# Patient Record
Sex: Male | Born: 1980 | Race: White | Hispanic: No | Marital: Married | Smoking: Current some day smoker
Health system: Southern US, Community
[De-identification: ages and names within clinical notes are randomized; demographics above are authoritative.]

---
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
## 2005-06-22 ENCOUNTER — Emergency Department (HOSPITAL_COMMUNITY): Admission: EM | Admit: 2005-06-22 | Discharge: 2005-06-22 | Payer: Self-pay | Admitting: Emergency Medicine

## 2005-06-27 ENCOUNTER — Ambulatory Visit: Payer: Self-pay | Admitting: Internal Medicine

## 2005-06-28 ENCOUNTER — Inpatient Hospital Stay (HOSPITAL_COMMUNITY): Admission: EM | Admit: 2005-06-28 | Discharge: 2005-07-02 | Payer: Self-pay | Admitting: Emergency Medicine

## 2005-08-15 ENCOUNTER — Ambulatory Visit: Payer: Self-pay | Admitting: Internal Medicine

## 2008-03-12 HISTORY — PX: ANTERIOR CRUCIATE LIGAMENT REPAIR: SHX115

## 2008-11-17 ENCOUNTER — Emergency Department: Payer: Self-pay | Admitting: Emergency Medicine

## 2014-07-24 ENCOUNTER — Encounter (HOSPITAL_COMMUNITY): Payer: Self-pay | Admitting: Emergency Medicine

## 2014-07-24 ENCOUNTER — Emergency Department (HOSPITAL_COMMUNITY)
Admission: EM | Admit: 2014-07-24 | Discharge: 2014-07-24 | Disposition: A | Discharge status: Home / Self Care | Payer: BLUE CROSS/BLUE SHIELD | Attending: Emergency Medicine | Admitting: Emergency Medicine

## 2014-07-24 DIAGNOSIS — K0381 Cracked tooth: Secondary | ICD-10-CM | POA: Insufficient documentation

## 2014-07-24 DIAGNOSIS — K088 Other specified disorders of teeth and supporting structures: Secondary | ICD-10-CM | POA: Insufficient documentation

## 2014-07-24 DIAGNOSIS — K0889 Other specified disorders of teeth and supporting structures: Secondary | ICD-10-CM

## 2014-07-24 DIAGNOSIS — Z720 Tobacco use: Secondary | ICD-10-CM | POA: Insufficient documentation

## 2014-07-24 MED ORDER — HYDROCODONE-ACETAMINOPHEN 5-325 MG PO TABS: 1.0000 | ORAL_TABLET | ORAL | Status: DC | PRN

## 2014-07-24 NOTE — Discharge Instructions (Signed)
Read the information below.  Use the prescribed medication as directed.  Please discuss all new medications with your pharmacist.  Do not take additional tylenol while taking the prescribed pain medication to avoid overdose.  You may return to the Emergency Department at any time for worsening condition or any new symptoms that concern you.  If you develop fevers, swelling in your face, difficulty swallowing or breathing, return to the ER immediately for a recheck.    Dental Pain A tooth ache may be caused by cavities (tooth decay). Cavities expose the nerve of the tooth to air and hot or cold temperatures. It may come from an infection or abscess (also called a boil or furuncle) around your tooth. It is also often caused by dental caries (tooth decay). This causes the pain you are having. DIAGNOSIS  Your caregiver can diagnose this problem by exam. TREATMENT   If caused by an infection, it may be treated with medications which kill germs (antibiotics) and pain medications as prescribed by your caregiver. Take medications as directed.  Only take over-the-counter or prescription medicines for pain, discomfort, or fever as directed by your caregiver.  Whether the tooth ache today is caused by infection or dental disease, you should see your dentist as soon as possible for further care. SEEK MEDICAL CARE IF: The exam and treatment you received today has been provided on an emergency basis only. This is not a substitute for complete medical or dental care. If your problem worsens or new problems (symptoms) appear, and you are unable to meet with your dentist, call or return to this location. SEEK IMMEDIATE MEDICAL CARE IF:   You have a fever.  You develop redness and swelling of your face, jaw, or neck.  You are unable to open your mouth.  You have severe pain uncontrolled by pain medicine. MAKE SURE YOU:   Understand these instructions.  Will watch your condition.  Will get help right  away if you are not doing well or get worse. Document Released: 02/26/2005 Document Revised: 05/21/2011 Document Reviewed: 10/15/2007 [facility] Patient Information 2015 [employer] [place]. This information is not intended to replace advice given to you by your health care provider. Make sure you discuss any questions you have with your health care provider.

## 2014-07-24 NOTE — ED Provider Notes (Signed)
CSN: [id number]     Arrival date & time 07/24/14  0036 History   First MD Initiated Contact with Patient 07/24/14 [phone]     Chief Complaint  Patient presents with  . Dental Pain     (Consider location/radiation/quality/duration/timing/severity/associated sxs/prior Treatment) HPI   Patient presents with left lower molar pain.  States he had a cavity that broke several weeks ago, developed pain three days ago and severe pain today.  Was seen at Urgent Care earlier today and was given toradol IM and toradol prescription.  Has been unable to sleep secondary to pain.  The only relief he has gotten is from swishing cold water around the tooth.  [facility]  Denies fevers, facial swelling, sore throat, difficulty swallowing or breathing.    History reviewed. No pertinent past medical history. Past Surgical History  Procedure Laterality Date  . Anterior cruciate ligament repair  2010   No family history on file. History  Substance Use Topics  . Smoking status: Current Some Day Smoker    Types: Cigarettes  . Smokeless tobacco: Not on file  . Alcohol Use: No    Review of Systems  Constitutional: Negative for fever and chills.  HENT: Positive for dental problem. Negative for facial swelling, sore throat and trouble swallowing.   Gastrointestinal: Negative for nausea and vomiting.  Musculoskeletal: Negative for neck pain and neck stiffness.  Skin: Negative for color change and wound.  Allergic/Immunologic: Negative for immunocompromised state.  Psychiatric/Behavioral: Negative for self-injury.      Allergies  Review of patient's allergies indicates no known allergies.  Home Medications   Prior to Admission medications   Medication Sig Start Date End Date Taking? Authorizing Provider  HYDROcodone-acetaminophen (NORCO/VICODIN) 5-325 MG per tablet Take 1-2 tablets by mouth every 4 (four) hours as needed. 07/24/14   Trixie DredgeEmily  Talullah Abate, PA-C   BP 129/84 mmHg  Pulse 66  Temp(Src) 98.1 F (36.7 C) (Oral)  Resp 16  Ht 6\' 3"  (1.905 m)  Wt 262 lb 12.6 oz (119.2 kg)  BMI 32.85 kg/m2  SpO2 97% Physical Exam  Constitutional: He appears well-developed and well-nourished. No distress.  HENT:  Head: Normocephalic and atraumatic.  Mouth/Throat: Uvula is midline and oropharynx is clear and moist. Mucous membranes are not dry. Abnormal dentition. No uvula swelling. No oropharyngeal exudate, posterior oropharyngeal edema, posterior oropharyngeal erythema or tonsillar abscesses.    Gingiva without erythema, edema, discharge.  No facial swelling or tenderness.    Eyes: Conjunctivae are normal.  Neck: Normal range of motion. Neck supple.  Cardiovascular: Normal rate.   Pulmonary/Chest: Effort normal and breath sounds normal. No stridor.  Lymphadenopathy:    He has no cervical adenopathy.  Neurological: He is alert.  Skin: He is not diaphoretic.  Psychiatric: He has a normal mood and affect. His behavior is normal.  Nursing note and vitals reviewed.   ED Course  Procedures (including critical care time) Labs Review Labs Reviewed - No data to display  Imaging Review No results found.   EKG Interpretation None      MDM   Final diagnoses:  Pain, dental    Afebrile, nontoxic patient with new dental pain.  No obvious abscess.  No concerning findings on exam.  Doubt deep space head or neck infection.  Doubt Ludwig's angina.  D/C home with pain medication and dental follow up tomorrow.  Given close follow up with dentist in approximately 12 hours and no  obvious abscess, will defer decision regarding antibiotics to dentist.  Discussed findings, treatment, and follow up  with patient.  Pt given return precautions.  Pt verbalizes understanding and agrees with plan.         Trixie Dredgemily Tyshun Tuckerman, PA-C 07/24/14 [id number]  Mirian MoMatthew Gentry, MD 07/26/14 [phone]

## 2014-07-24 NOTE — ED Notes (Signed)
Pt reports a cracked tooth in his upper left mouth, pt reports onset pain 3 days. Pt reports he has an appt tomorrow at 1400 with the dentist. Pt he for pain management. [facility], pt reports it is not managing his pain and he is not able to sleep.

## 2020-07-16 ENCOUNTER — Other Ambulatory Visit: Payer: Self-pay

## 2020-07-16 ENCOUNTER — Emergency Department (HOSPITAL_COMMUNITY): Payer: BC Managed Care – PPO

## 2020-07-16 ENCOUNTER — Encounter (HOSPITAL_COMMUNITY): Payer: Self-pay | Admitting: Emergency Medicine

## 2020-07-16 ENCOUNTER — Observation Stay (HOSPITAL_COMMUNITY)
Admission: EM | Admit: 2020-07-16 | Discharge: 2020-07-17 | Disposition: A | Discharge status: Home / Self Care | Payer: BC Managed Care – PPO | Attending: Internal Medicine | Admitting: Internal Medicine

## 2020-07-16 DIAGNOSIS — H1089 Other conjunctivitis: Secondary | ICD-10-CM | POA: Diagnosis not present

## 2020-07-16 DIAGNOSIS — Z20822 Contact with and (suspected) exposure to covid-19: Secondary | ICD-10-CM | POA: Diagnosis not present

## 2020-07-16 DIAGNOSIS — H109 Unspecified conjunctivitis: Secondary | ICD-10-CM | POA: Diagnosis present

## 2020-07-16 DIAGNOSIS — B9689 Other specified bacterial agents as the cause of diseases classified elsewhere: Secondary | ICD-10-CM | POA: Diagnosis present

## 2020-07-16 DIAGNOSIS — R059 Cough, unspecified: Secondary | ICD-10-CM | POA: Insufficient documentation

## 2020-07-16 DIAGNOSIS — R42 Dizziness and giddiness: Secondary | ICD-10-CM | POA: Diagnosis present

## 2020-07-16 DIAGNOSIS — F17210 Nicotine dependence, cigarettes, uncomplicated: Secondary | ICD-10-CM | POA: Insufficient documentation

## 2020-07-16 DIAGNOSIS — R413 Other amnesia: Secondary | ICD-10-CM

## 2020-07-16 DIAGNOSIS — J189 Pneumonia, unspecified organism: Secondary | ICD-10-CM

## 2020-07-16 DIAGNOSIS — G9340 Encephalopathy, unspecified: Principal | ICD-10-CM | POA: Insufficient documentation

## 2020-07-16 DIAGNOSIS — R4789 Other speech disturbances: Secondary | ICD-10-CM

## 2020-07-16 LAB — I-STAT CHEM 8, ED
BUN: 19 mg/dL (ref 6–20)
Calcium, Ion: 1.24 mmol/L (ref 1.15–1.40)
Chloride: 105 mmol/L (ref 98–111)
Creatinine, Ser: 1 mg/dL (ref 0.61–1.24)
Glucose, Bld: 105 mg/dL — ABNORMAL HIGH (ref 70–99)
HCT: 43 % (ref 39.0–52.0)
Hemoglobin: 14.6 g/dL (ref 13.0–17.0)
Potassium: 3.6 mmol/L (ref 3.5–5.1)
Sodium: 144 mmol/L (ref 135–145)
TCO2: 28 mmol/L (ref 22–32)

## 2020-07-16 LAB — APTT: aPTT: 28 seconds (ref 24–36)

## 2020-07-16 LAB — COMPREHENSIVE METABOLIC PANEL
ALT: 31 U/L (ref 0–44)
AST: 20 U/L (ref 15–41)
Albumin: 4.4 g/dL (ref 3.5–5.0)
Alkaline Phosphatase: 92 U/L (ref 38–126)
Anion gap: 6 (ref 5–15)
BUN: 16 mg/dL (ref 6–20)
CO2: 26 mmol/L (ref 22–32)
Calcium: 9.3 mg/dL (ref 8.9–10.3)
Chloride: 108 mmol/L (ref 98–111)
Creatinine, Ser: 1.07 mg/dL (ref 0.61–1.24)
GFR, Estimated: >60 mL/min (ref >60)
Glucose, Bld: 109 mg/dL — ABNORMAL HIGH (ref 70–99)
Potassium: 3.7 mmol/L (ref 3.5–5.1)
Sodium: 140 mmol/L (ref 135–145)
Total Bilirubin: 0.6 mg/dL (ref 0.3–1.2)
Total Protein: 7.2 g/dL (ref 6.5–8.1)

## 2020-07-16 LAB — CSF CELL COUNT WITH DIFFERENTIAL
RBC Count, CSF: 1000 /mm3 — ABNORMAL HIGH
RBC Count, CSF: 14 /mm3 — ABNORMAL HIGH
Tube #: 1
Tube #: 4
WBC, CSF: 1 /mm3 (ref 0–5)
WBC, CSF: 4 /mm3 (ref 0–5)

## 2020-07-16 LAB — CBC
HCT: 45.1 % (ref 39.0–52.0)
Hemoglobin: 14.7 g/dL (ref 13.0–17.0)
MCH: 30.8 pg (ref 26.0–34.0)
MCHC: 32.6 g/dL (ref 30.0–36.0)
MCV: 94.4 fL (ref 80.0–100.0)
Platelets: 260 10*3/uL (ref 150–400)
RBC: 4.78 MIL/uL (ref 4.22–5.81)
RDW: 12.7 % (ref 11.5–15.5)
WBC: 9.7 10*3/uL (ref 4.0–10.5)
nRBC: 0 % (ref 0.0–0.2)

## 2020-07-16 LAB — DIFFERENTIAL
Abs Immature Granulocytes: 0.05 10*3/uL (ref 0.00–0.07)
Basophils Absolute: 0.1 10*3/uL (ref 0.0–0.1)
Basophils Relative: 1 %
Eosinophils Absolute: 0.2 10*3/uL (ref 0.0–0.5)
Eosinophils Relative: 2 %
Immature Granulocytes: 1 %
Lymphocytes Relative: 40 %
Lymphs Abs: 3.8 10*3/uL (ref 0.7–4.0)
Monocytes Absolute: 0.7 10*3/uL (ref 0.1–1.0)
Monocytes Relative: 7 %
Neutro Abs: 4.9 10*3/uL (ref 1.7–7.7)
Neutrophils Relative %: 49 %

## 2020-07-16 LAB — RESP PANEL BY RT-PCR (FLU A&B, COVID) ARPGX2
Influenza A by PCR: NEGATIVE
Influenza B by PCR: NEGATIVE
SARS Coronavirus 2 by RT PCR: NEGATIVE

## 2020-07-16 LAB — PROTIME-INR
INR: 1 (ref 0.8–1.2)
Prothrombin Time: 13.5 seconds (ref 11.4–15.2)

## 2020-07-16 LAB — PROTEIN AND GLUCOSE, CSF
Glucose, CSF: 67 mg/dL (ref 40–70)
Total  Protein, CSF: 26 mg/dL (ref 15–45)

## 2020-07-16 MED ORDER — SODIUM CHLORIDE 0.9% FLUSH
3.0000 mL | Freq: Once | INTRAVENOUS | Status: AC
Start: 2020-07-16 — End: 2020-07-17
  Administered 2020-07-17: 3 mL via INTRAVENOUS

## 2020-07-16 MED ORDER — GADOBUTROL 1 MMOL/ML IV SOLN
10.0000 mL | Freq: Once | INTRAVENOUS | Status: AC | PRN
  Administered 2020-07-16: 10 mL via INTRAVENOUS

## 2020-07-16 MED ORDER — LIDOCAINE HCL (PF) 1 % IJ SOLN
INTRAMUSCULAR | Status: AC
  Filled 2020-07-16: qty 30

## 2020-07-16 MED ORDER — LORAZEPAM 2 MG/ML IJ SOLN
2.0000 mg | Freq: Once | INTRAMUSCULAR | Status: AC
  Administered 2020-07-16: 2 mg via INTRAVENOUS
  Filled 2020-07-16: qty 1

## 2020-07-16 NOTE — ED Notes (Signed)
This writer pulled 30 mL of 1% Lidocaine for provider for LP procedure.

## 2020-07-16 NOTE — Consult Note (Signed)
Neurology Consultation  Reason for Consult: Speech difficulty, altered mental status-code stroke Referring Physician: Dr. Gerhard Munch  CC: Speech difficulty, altered mental status  History is obtained from: Patient, wife  HPI: Jorge Swanson is a 40 y.o. male no significant past medical history, who was in his usual state of health till about 4:30 PM today when he took his daughter for vaccination at the local pharmacy, then called his wife to come pick him up because he did not feel right.  He described not feeling in his usual state of health and his wife said that he called him multiple times forgetting that he had called her earlier to ask her to come pick him up. His speech has been tangential.  He has been having difficulty forming complete sentences. 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 He works a full-time job as a Warehouse manager of a company and is a father of 5 children, married. Does not do illicit drugs.  Drinks alcohol-4-5 beers over the weekend and maybe 1-2 drinks per week on weekdays. Denies any recent illnesses or sicknesses.  Denies any toxic exposure. Has history of some sort of an autoimmune Disease where she has a brain lesion which was biopsied but no specific results came out of it and a repeat biopsy was recommended.  On repeat imaging of the mother, after steroids, the lesion had resolved.  Unclear what kind of autoimmune family history he has. Reports he had a history of migraines for 3 to 4 years in the middle when he was in college in [employer].  Had symptoms of speech changes with some of those headaches but has not had them for years.  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  Has not had headaches since then.  Dr. Jeraldine Loots called me to discuss this case with me-initial concern was some for some word finding  difficulty and repetitive questioning-I asked him to activate a code stroke given the young age of the patient and he was still in the window for IV tPA. 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 Cough for  Last few months. Redness right eye - wears 30 day lenses for months at a time.  History reviewed. No pertinent past medical history. Past medical history: Documented in the HPI  Family history: Mother with autoimmune condition unspecified   Social History:   reports that he has been smoking cigarettes. He does not have any smokeless tobacco history on file. He reports current alcohol use. He reports that he does not use drugs.  Medications  Current Facility-Administered Medications:  .  sodium chloride flush (NS) 0.9 % injection 3 mL, 3 mL, Intravenous, Once, Gerhard Munch, MD  Current Outpatient Medications:  .  HYDROcodone-acetaminophen (NORCO/VICODIN) 5-325 MG per tablet, Take 1-2 tablets by mouth every 4 (four) hours as needed. (Patient not taking: No sig reported), Disp: 8 tablet, Rfl: 0   Exam: Current vital signs: BP (!) 133/95 (BP Location: Left Arm)   Pulse  99   Temp 98.4 F (36.9 C)   Resp 18   SpO2 98%  Vital signs in last 24 hours: Temp:  [98.4 F (36.9 C)] 98.4 F (36.9 C) (05/07 1845) Pulse Rate:  [99] 99 (05/07 1845) Resp:  [18] 18 (05/07 1845) BP: (133)/(95) 133/95 (05/07 1845) SpO2:  [98 %] 98 % (05/07 1845) General: Awake alert in no distress HEENT: Normocephalic atraumatic, right eye with severe conjunctival redness CVS: Regular rhythm Abdomen nondistended nontender Extremities warm well perfused Neurologic exam Awake alert oriented  x3 Attention concentration: Does not have reduced attention concentration but has tangential speech and has more wordy speech output which is out of character for him according to the wife Speech and language: Has fluent speech, with no dysarthria.  Able to name simple objects and repeat.  Able to comprehend. Cranial nerves: Pupils equal round react light, extract movements intact, visual fields full, face symmetric, facial sensation intact, auditory acuity intact, palate midline, shoulder shrug intact, tongue midline. Motor exam 5/5 in all fours without drift Sensory exam intact light touch without drift Coordination: No dysmetria, no asterixis Gait testing deferred but presumably was normal when he walked. NIH stroke scale--0  Labs I have reviewed labs in epic and the results pertinent to this consultation are:   CBC    Component Value Date/Time   WBC 9.7 07/16/2020 1856   RBC 4.78 07/16/2020 1856   HGB 14.6 07/16/2020 1857   HCT 43.0 07/16/2020 1857   PLT 260 07/16/2020 1856   MCV 94.4 07/16/2020 1856   MCH 30.8 07/16/2020 1856   MCHC 32.6 07/16/2020 1856   RDW 12.7 07/16/2020 1856   LYMPHSABS 3.8 07/16/2020 1856   MONOABS 0.7 07/16/2020 1856   EOSABS 0.2 07/16/2020 1856   BASOSABS 0.1 07/16/2020 1856    CMP     Component Value Date/Time   NA 144 07/16/2020 1857   K 3.6 07/16/2020 1857   CL 105 07/16/2020 1857   CO2 26 07/16/2020 1856   GLUCOSE 105 (H) 07/16/2020 1857   BUN 19 07/16/2020 1857   CREATININE 1.00 07/16/2020 1857   CALCIUM 9.3 07/16/2020 1856   PROT 7.2 07/16/2020 1856   ALBUMIN 4.4 07/16/2020 1856   AST 20 07/16/2020 1856   ALT 31 07/16/2020 1856   ALKPHOS 92 07/16/2020 1856   BILITOT 0.6 07/16/2020 1856   GFRNONAA >60 07/16/2020 1856    Imaging I have reviewed the images obtained:  CT-scan of the brain- no acute changes, normal head CT  MRI examination of the brain with and without contrast  Assessment:  40 year old man with sudden onset  of altered mental status.  He was with his daughter at a pharmacy and called his wife saying he is not feeling well.  He called her again forgetting that he had called her laterally 5 minutes ago.  He has had some repetition of questioning.  Wife also noted that his speech has become more wordy without any obvious speech deficits but felt that he was having trouble saying what he wanted to say. I was called to consult on him and recommended a code stroke be activated since his last known normal was within 4-1/2 hours. Is exam was nonfocal-see above but he was very tangential and wordy in his speech which is out of character, and also had some hurried speech again out of character for him. Noncontrast head CT was unremarkable I took him for a stat MRI of the brain with and without contrast-preliminary review of  the DWI images negative for stroke.  Full sequences pending at this time. Differentials include autoimmune encephalitis/viral encephalitis/TGA Toxic metabolic encephalopathy should also be considered in the differentials  Impression: Differentials include autoimmune encephalitis/viral encephalitis/TGA Toxic metabolic encephalopathy should also be considered in the differentials  Recommendations:  Stat MRI brain with and without contrast-being done right now.  Await full results.  If there is no abnormality seen on the brain MRI, I would recommend doing a spinal tap and obtain at least 12 to 15 cc of CSF-and send the CSF for glucose, protein, cell count, HSV PCR, gram stain, CSF culture, autoimmune encephalitis panel. I will update recommendations and the above results are made available. Discussed my plan with Dr. Jeraldine Loots in the ER Discussed my plan with the wife at bedside as well.  -- Milon Dikes, MD Neurologist [facility] Pager: [phone]  ADDENDUM MRI brain with and without contrast  - normal. His mother texted-she has neurosarcoid-that is what the patient  earlier referred to. Patient has had cough for 2 months. Also primary team suspicious for bacterial conjunctivitis. Pursuing a broad encephalopathy w/u. CSF with 1000 RBC, 4 WBC, glucose 67, protein 26. Not looking concerning for CNS infectious or inflammatory process.  Will recommend overnight observation and EEG in the AM. B12, TSH, Ammonia should be checked along with RPR, HIV.  D/W Dr. Leafy Half - admitting hospitalist.  -- Milon Dikes, MD Neurologist [facility] Pager: [phone]

## 2020-07-16 NOTE — ED Triage Notes (Addendum)
Pt reports he is having approx 30 second intervals of forgetting what he is saying or what is happening.  Also reports intermittent dizziness.  States symptoms started at 6pm while in [place] for his daughter to get her COVID vaccine.  No arm drift.  Denies numbness/weakness.

## 2020-07-16 NOTE — ED Provider Notes (Signed)
[facility] EMERGENCY DEPARTMENT Provider Note   CSN: [id number] Arrival date & time: 07/16/20  1838     History Chief Complaint  Patient presents with  . Dizziness    Jorge Swanson is a 40 y.o. male.  HPI Patient presents with sudden onset memory loss and speech difficulty.  History is obtained by the patient, though his wife joins him later in the evaluation, and corroborates history.  Patient was in his usual state of health prior to about 1 hour ago.  At that time, while in a similar stressful situation he felt onset of difficulty with speech and forgetfulness.  Subsequently he has had multiple episodes of memory lapse, with inability to recall what occurred several minutes prior.  He notes some difficulty with finding words, but no obvious slurring of speech, no weakness in any extremity, no pain.  Patient has a history of prior cardiac ablation as a youth, but no current cardiac issues, he also denies any substantial health issues.  He drinks socially, vapes.  He does note that he may have used a different material in his vape today as well. Wife notes the patient called twice requesting to be picked up because he was feeling unwell, but did not recall calling given the first time.    History reviewed. No pertinent past medical history.  There are no problems to display for this patient.   Past Surgical History:  Procedure Laterality Date  . ANTERIOR CRUCIATE LIGAMENT REPAIR  2010       No family history on file.  Social History   Tobacco Use  . Smoking status: Current Some Day Smoker    Types: Cigarettes  Substance Use Topics  . Alcohol use: Yes  . Drug use: Never    Home Medications Prior to Admission medications   Medication Sig Start Date End Date Taking? Authorizing Provider  HYDROcodone-acetaminophen (NORCO/VICODIN) 5-325 MG per tablet Take 1-2 tablets by mouth every 4 (four) hours as needed. Patient not taking: No sig reported 07/24/14    Trixie Dredge, [place]    Allergies    Patient has no known allergies.  Review of Systems   Review of Systems  Constitutional:       Per HPI, otherwise negative  HENT:       Per HPI, otherwise negative  Respiratory:       Per HPI, otherwise negative  Cardiovascular:       Per HPI, otherwise negative  Gastrointestinal: Negative for vomiting.  Endocrine:       Negative aside from HPI  Genitourinary:       Neg aside from HPI   Musculoskeletal:       Per HPI, otherwise negative  Skin: Negative.   Neurological: Positive for speech difficulty. Negative for syncope.    Physical Exam Updated Vital Signs BP 127/79   Pulse 85   Temp 98.4 F (36.9 C)   Resp 18   SpO2 97%   Physical Exam Vitals and nursing note reviewed.  Constitutional:      General: He is not in acute distress.    Appearance: He is well-developed.  HENT:     Head: Normocephalic and atraumatic.  Eyes:     Conjunctiva/sclera: Conjunctivae normal.  Cardiovascular:     Rate and Rhythm: Normal rate and regular rhythm.  Pulmonary:     Effort: Pulmonary effort is normal. No respiratory distress.     Breath sounds: No stridor.  Abdominal:     General: There  is no distension.  Skin:    General: Skin is warm and dry.  Neurological:     Mental Status: He is alert and oriented to person, place, and time.     Cranial Nerves: Cranial nerves are intact.     Motor: No weakness, tremor, atrophy or abnormal muscle tone.     Comments: Patient with occasional halting speech. He also notes that during our conversation he has frequent periods during which she does not recall what he said a few minutes prior.  Otherwise neurologic exam unremarkable. Moves all extremity spontaneously, no facial asymmetry, no confusion.  Psychiatric:        Mood and Affect: Mood normal.        Behavior: Behavior normal.      ED Results / Procedures / Treatments   Labs (all labs ordered are listed, but only abnormal results are  displayed) Labs Reviewed  COMPREHENSIVE METABOLIC PANEL - Abnormal; Notable for the following components:      Result Value   Glucose, Bld 109 (*)    All other components within normal limits  I-STAT CHEM 8, ED - Abnormal; Notable for the following components:   Glucose, Bld 105 (*)    All other components within normal limits  RESP PANEL BY RT-PCR (FLU A&B, COVID) ARPGX2  PROTIME-INR  APTT  CBC  DIFFERENTIAL  RAPID URINE DRUG SCREEN, HOSP PERFORMED  PROTEIN AND GLUCOSE, CSF  HSV 1/2 PCR, CSF  CSF CELL COUNT WITH DIFFERENTIAL  CSF CELL COUNT WITH DIFFERENTIAL  MISC [facility] TEST (SEND OUT)  IGG CSF INDEX  DRAW EXTRA CLOT TUBE  OLIGOCLONAL BANDS, CSF + SERM  DRAW EXTRA CLOT TUBE  CBG MONITORING, ED    EKG EKG Interpretation  Date/Time:  Saturday Jul 16 2020 18:46:15 EDT Ventricular Rate:  104 PR Interval:  154 QRS Duration: 100 QT Interval:  352 QTC Calculation: 462 R Axis:   57 Text Interpretation: Sinus tachycardia Otherwise normal ECG Confirmed by Gerhard Munch [id number]) on 07/16/2020 6:56:20 PM   Radiology CT HEAD WO CONTRAST  Result Date: 07/16/2020 CLINICAL DATA:  Neuro deficit, acute, stroke suspected EXAM: CT HEAD WITHOUT CONTRAST TECHNIQUE: Contiguous axial images were obtained from the base of the skull through the vertex without intravenous contrast. COMPARISON:  06/27/2005 FINDINGS: Brain: There is no mass, hemorrhage or extra-axial collection. The size and configuration of the ventricles and extra-axial CSF spaces are normal. The brain parenchyma is normal, without acute or chronic infarction. Vascular: No abnormal hyperdensity of the major intracranial arteries or dural venous sinuses. No intracranial atherosclerosis. Skull: The visualized skull base, calvarium and extracranial soft tissues are normal. Sinuses/Orbits: No fluid levels or advanced mucosal thickening of the visualized paranasal sinuses. No mastoid or middle ear effusion. The orbits are normal.  IMPRESSION: Normal head CT. Electronically Signed   By: Deatra Robinson M.D.   On: 07/16/2020 19:58   MR BRAIN W WO CONTRAST  Result Date: 07/16/2020 CLINICAL DATA:  Delirium EXAM: MRI HEAD WITHOUT AND WITH CONTRAST TECHNIQUE: Multiplanar, multiecho pulse sequences of the brain and surrounding structures were obtained without and with intravenous contrast. CONTRAST:  43mL GADAVIST GADOBUTROL 1 MMOL/ML IV SOLN COMPARISON:  None. FINDINGS: Brain: No acute infarct, mass effect or extra-axial collection. No acute or chronic hemorrhage. Normal white matter signal, parenchymal volume and CSF spaces. The midline structures are normal. There is no abnormal contrast enhancement. Vascular: Major flow voids are preserved. Skull and upper cervical spine: Normal calvarium and skull base. Visualized upper cervical spine  and soft tissues are normal. Sinuses/Orbits:No paranasal sinus fluid levels or advanced mucosal thickening. No mastoid or middle ear effusion. Normal orbits. IMPRESSION: Normal brain MRI. Electronically Signed   By: Deatra Robinson M.D.   On: 07/16/2020 21:14    Procedures Procedures   Medications Ordered in ED Medications  sodium chloride flush (NS) 0.9 % injection 3 mL (has no administration in time range)  gadobutrol (GADAVIST) 1 MMOL/ML injection 10 mL (10 mLs Intravenous Contrast Given 07/16/20 2051)  LORazepam (ATIVAN) injection 2 mg (2 mg Intravenous Given 07/16/20 2150)  lidocaine (PF) (XYLOCAINE) 1 % injection (  Given by Other 07/16/20 2217)    ED Course  I have reviewed the triage vital signs and the nursing notes.  Pertinent labs & imaging results that were available during my care of the patient were reviewed by me and considered in my medical decision making (see chart for details).   Initial evaluation with consideration of acute change in mental status I discussed this case with our neurology colleague.  Patient designated as a code stroke.  Update: Code stroke canceled.  I again  discussed the patient's case with our neurology colleagues, this time at bedside. With wife present we discussed possibilities for his memory loss, word finding difficulty.  Initial studies reviewed, discussed, including MRI, CT, no evidence for acute stroke, mass, hemorrhage.  With additional considerations including cerebritis, infection, patient amenable to, appropriate for lumbar puncture.   10:50 PM Tolerated procedure well, please see resident physician's note for full details.  MDM Rules/Calculators/A&P Previously well adult male presents after acute onset memory loss and word finding difficulty.  Patient does admit is a code stroke, but initial findings were reassuring in terms of acute ischemic or hemorrhagic pathology.  Patient continued to have word finding difficulty, memory loss throughout his ED course, requiring additional studies, including lumbar puncture, labs.  Case thoroughly discussed with the neurology colleagues, patient admitted with plan for EEG tomorrow, ongoing monitoring, pending LP results.  MDM Number of Diagnoses or Management Options Memory loss, short term: new, needed workup Word finding difficulty: new, needed workup   Amount and/or Complexity of Data Reviewed Clinical lab tests: reviewed and ordered Tests in the radiology section of CPT: ordered and reviewed Tests in the medicine section of CPT: ordered and reviewed Obtain history from someone other than the patient: yes Discuss the patient with other providers: yes Independent visualization of images, tracings, or specimens: yes  Risk of Complications, Morbidity, and/or Mortality Presenting problems: high Diagnostic procedures: high Management options: high  Critical Care Total time providing critical care: < 30 minutes  Patient Progress Patient progress: stable  Final Clinical Impression(s) / ED Diagnoses Final diagnoses:  Memory loss, short term  Word finding difficulty     Gerhard Munch, MD 07/16/20 2253

## 2020-07-17 ENCOUNTER — Observation Stay (HOSPITAL_COMMUNITY): Payer: BC Managed Care – PPO

## 2020-07-17 ENCOUNTER — Encounter (HOSPITAL_COMMUNITY): Payer: Self-pay | Admitting: Internal Medicine

## 2020-07-17 DIAGNOSIS — B9689 Other specified bacterial agents as the cause of diseases classified elsewhere: Secondary | ICD-10-CM

## 2020-07-17 DIAGNOSIS — F17210 Nicotine dependence, cigarettes, uncomplicated: Secondary | ICD-10-CM

## 2020-07-17 DIAGNOSIS — G9340 Encephalopathy, unspecified: Secondary | ICD-10-CM | POA: Diagnosis not present

## 2020-07-17 DIAGNOSIS — R059 Cough, unspecified: Secondary | ICD-10-CM

## 2020-07-17 DIAGNOSIS — H109 Unspecified conjunctivitis: Secondary | ICD-10-CM | POA: Diagnosis not present

## 2020-07-17 HISTORY — DX: Nicotine dependence, cigarettes, uncomplicated: F17.210

## 2020-07-17 LAB — URINALYSIS, COMPLETE (UACMP) WITH MICROSCOPIC
Bilirubin Urine: NEGATIVE
Glucose, UA: NEGATIVE mg/dL
Hgb urine dipstick: NEGATIVE
Ketones, ur: NEGATIVE mg/dL
Leukocytes,Ua: NEGATIVE
Nitrite: NEGATIVE
Protein, ur: NEGATIVE mg/dL
Specific Gravity, Urine: 1.027 (ref 1.005–1.030)
pH: 6 (ref 5.0–8.0)

## 2020-07-17 LAB — C-REACTIVE PROTEIN: CRP: 1 mg/dL — ABNORMAL HIGH (ref <1.0)

## 2020-07-17 LAB — TSH: TSH: 2.042 u[IU]/mL (ref 0.350–4.500)

## 2020-07-17 LAB — RAPID URINE DRUG SCREEN, HOSP PERFORMED
Amphetamines: NOT DETECTED
Barbiturates: NOT DETECTED
Benzodiazepines: NOT DETECTED
Cocaine: NOT DETECTED
Opiates: NOT DETECTED
Tetrahydrocannabinol: POSITIVE — AB

## 2020-07-17 LAB — FOLATE: Folate: 5.1 ng/mL — ABNORMAL LOW (ref >5.9)

## 2020-07-17 LAB — AMMONIA: Ammonia: 35 umol/L (ref 9–35)

## 2020-07-17 LAB — VITAMIN B12: Vitamin B-12: 317 pg/mL (ref 180–914)

## 2020-07-17 LAB — RPR: RPR Ser Ql: NONREACTIVE

## 2020-07-17 MED ORDER — CIPROFLOXACIN HCL 0.3 % OP SOLN: 2.0000 [drp] | OPHTHALMIC | 0 refills | Status: DC

## 2020-07-17 MED ORDER — CIPROFLOXACIN HCL 0.3 % OP SOLN
2.0000 [drp] | OPHTHALMIC | Status: DC
  Administered 2020-07-17 (×4): 2 [drp] via OPHTHALMIC
  Filled 2020-07-17 (×2): qty 2.5

## 2020-07-17 MED ORDER — LACTATED RINGERS IV BOLUS
1000.0000 mL | Freq: Once | INTRAVENOUS | Status: AC
  Administered 2020-07-17: 1000 mL via INTRAVENOUS

## 2020-07-17 MED ORDER — VITAMIN B-12 1000 MCG PO TABS
2000.0000 ug | ORAL_TABLET | Freq: Every day | ORAL | Status: DC
  Administered 2020-07-17: 2000 ug via ORAL
  Filled 2020-07-17: qty 2

## 2020-07-17 MED ORDER — LACTATED RINGERS IV SOLN: INTRAVENOUS | Status: AC

## 2020-07-17 MED ORDER — CIPROFLOXACIN HCL 0.3 % OP SOLN: 2.0000 [drp] | OPHTHALMIC | Status: DC

## 2020-07-17 NOTE — Progress Notes (Signed)
Subjective: Feels almost back to baseline. States that his symptoms began about 1 hour after eating a wafer-sized chocolate candy at a vape shop. The candy had "hemp oil" listed as one of the ingredients.   Objective: Current vital signs: BP 113/71   Pulse 68   Temp 98.4 F (36.9 C)   Resp 16   SpO2 94%  Vital signs in last 24 hours: Temp:  [98.4 F (36.9 C)] 98.4 F (36.9 C) (05/07 1845) Pulse Rate:  [65-99] 68 (05/08 0400) Resp:  [16-22] 16 (05/08 0400) BP: (107-135)/(71-95) 113/71 (05/08 0400) SpO2:  [91 %-98 %] 94 % (05/08 0400)  Intake/Output from previous day: 05/07 0701 - 05/08 0700 In: 1000 [IV Piggyback:1000] Out: -  Intake/Output this shift: No intake/output data recorded. Nutritional status:  Diet Order            Diet NPO time specified  Diet effective now                HEENT: [place]/AT Lungs: Respirations unlabored  Neurologic Exam: Ment: Alert and fully oriented. Speech fluent with intact comprehension and naming. Able to recite the months of the year forwards and backwards without difficulty.  CN: Fixates and tracks normally. EOMI. Face symmetric.  Motor: Moves all 4 extremities normally without evidence for weakness.  Cerebellar: No ataxia  Lab Results: Results for orders placed or performed during the hospital encounter of 07/16/20 (from the past 48 hour(s))  Protime-INR     Status: None   Collection Time: 07/16/20  6:56 PM  Result Value Ref Range   Prothrombin Time 13.5 11.4 - 15.2 seconds   INR 1.0 0.8 - 1.2    Comment: (NOTE) INR goal varies based on device and disease states. Performed at [facility], [address] [place], [place] [address]   APTT     Status: None   Collection Time: 07/16/20  6:56 PM  Result Value Ref Range   aPTT 28 24 - 36 seconds    Comment: Performed at [facility], [address] [place], [place] [address]  CBC     Status: None   Collection Time: 07/16/20  6:56 PM  Result Value Ref Range   WBC  9.7 4.0 - 10.5 K/uL   RBC 4.78 4.22 - 5.81 MIL/uL   Hemoglobin 14.7 13.0 - 17.0 g/dL   HCT 82.945.1 56.239.0 - 13.052.0 %   MCV 94.4 80.0 - 100.0 fL   MCH 30.8 26.0 - 34.0 pg   MCHC 32.6 30.0 - 36.0 g/dL   RDW 86.512.7 78.411.5 - 69.615.5 %   Platelets 260 150 - 400 K/uL   nRBC 0.0 0.0 - 0.2 %    Comment: Performed at [facility], [address] [place], [place] [address]  Differential     Status: None   Collection Time: 07/16/20  6:56 PM  Result Value Ref Range   Neutrophils Relative % 49 %   Neutro Abs 4.9 1.7 - 7.7 K/uL   Lymphocytes Relative 40 %   Lymphs Abs 3.8 0.7 - 4.0 K/uL   Monocytes Relative 7 %   Monocytes Absolute 0.7 0.1 - 1.0 K/uL   Eosinophils Relative 2 %   Eosinophils Absolute 0.2 0.0 - 0.5 K/uL   Basophils Relative 1 %   Basophils Absolute 0.1 0.0 - 0.1 K/uL   Immature Granulocytes 1 %   Abs Immature Granulocytes 0.05 0.00 - 0.07 K/uL    Comment: Performed at [facility], [address], [place], [place] [address]  Comprehensive metabolic panel     Status: Abnormal   Collection Time: 07/16/20  6:56 PM  Result Value Ref Range   Sodium 140 135 - 145 mmol/L   Potassium 3.7 3.5 - 5.1 mmol/L   Chloride 108 98 - 111 mmol/L   CO2 26 22 - 32 mmol/L   Glucose, Bld 109 (H) 70 - 99 mg/dL    Comment: Glucose reference range applies only to samples taken after fasting for at least 8 hours.   BUN 16 6 - 20 mg/dL   Creatinine, Ser 9.81 0.61 - 1.24 mg/dL   Calcium 9.3 8.9 - 19.1 mg/dL   Total Protein 7.2 6.5 - 8.1 g/dL   Albumin 4.4 3.5 - 5.0 g/dL   AST 20 15 - 41 U/L   ALT 31 0 - 44 U/L   Alkaline Phosphatase 92 38 - 126 U/L   Total Bilirubin 0.6 0.3 - 1.2 mg/dL   GFR, Estimated >47 >82 mL/min    Comment: (NOTE) Calculated using the CKD-EPI Creatinine Equation (2021)    Anion gap 6 5 - 15    Comment: Performed at [facility], [address], [place], [place] [address]  I-stat chem 8, ED     Status: Abnormal   Collection Time: 07/16/20  6:57 PM  Result Value Ref  Range   Sodium 144 135 - 145 mmol/L   Potassium 3.6 3.5 - 5.1 mmol/L   Chloride 105 98 - 111 mmol/L   BUN 19 6 - 20 mg/dL   Creatinine, Ser 3.08 0.61 - 1.24 mg/dL   Glucose, Bld 657 (H) 70 - 99 mg/dL    Comment: Glucose reference range applies only to samples taken after fasting for at least 8 hours.   Calcium, Ion 1.24 1.15 - 1.40 mmol/L   TCO2 28 22 - 32 mmol/L   Hemoglobin 14.6 13.0 - 17.0 g/dL   HCT 84.6 96.2 - 95.2 %  Resp Panel by RT-PCR (Flu A&B, Covid) Nasopharyngeal Swab     Status: None   Collection Time: 07/16/20 10:00 PM   Specimen: Nasopharyngeal Swab; Nasopharyngeal(NP) swabs in vial transport medium  Result Value Ref Range   SARS Coronavirus 2 by RT PCR NEGATIVE NEGATIVE    Comment: (NOTE) SARS-CoV-2 target nucleic acids are NOT DETECTED.  The SARS-CoV-2 RNA is generally detectable in upper respiratory specimens during the acute phase of infection. The lowest concentration of SARS-CoV-2 viral copies this assay can detect is 138 copies/mL. A negative result does not preclude SARS-Cov-2 infection and should not be used as the sole basis for treatment or other patient management decisions. A negative result may occur with  improper specimen collection/handling, submission of specimen other than nasopharyngeal swab, presence of viral mutation(s) within the areas targeted by this assay, and inadequate number of viral copies(<138 copies/mL). A negative result must be combined with clinical observations, patient history, and epidemiological information. The expected result is Negative.  Fact Sheet for Patients:  BloggerCourse.com  Fact Sheet for Healthcare Providers:  SeriousBroker.it  This test is no t yet approved or cleared by the [place] FDA and  has been authorized for detection and/or diagnosis of SARS-CoV-2 by FDA under an Emergency Use Authorization (EUA). This EUA will remain  in effect (meaning this  test can be used) for the duration of the COVID-19 declaration under Section 564(b)(1) of the Act, 21 U.S.C.section 360bbb-3(b)(1), unless the authorization is terminated  or revoked sooner.  Influenza A by PCR NEGATIVE NEGATIVE   Influenza B by PCR NEGATIVE NEGATIVE    Comment: (NOTE) The Xpert Xpress SARS-CoV-2/FLU/RSV plus assay is intended as an aid in the diagnosis of influenza from Nasopharyngeal swab specimens and should not be used as a sole basis for treatment. Nasal washings and aspirates are unacceptable for Xpert Xpress SARS-CoV-2/FLU/RSV testing.  Fact Sheet for Patients: BloggerCourse.com  Fact Sheet for Healthcare Providers: SeriousBroker.it  This test is not yet approved or cleared by the [place] FDA and has been authorized for detection and/or diagnosis of SARS-CoV-2 by FDA under an Emergency Use Authorization (EUA). This EUA will remain in effect (meaning this test can be used) for the duration of the COVID-19 declaration under Section 564(b)(1) of the Act, 21 U.S.C. section 360bbb-3(b)(1), unless the authorization is terminated or revoked.  Performed at [facility], [address], [place], [place] [address]   Protein and glucose, CSF     Status: None   Collection Time: 07/16/20 10:05 PM  Result Value Ref Range   Glucose, CSF 67 40 - 70 mg/dL   Total  Protein, CSF 26 15 - 45 mg/dL    Comment: Performed at [facility], [address] [place], [place] [address]  CSF cell count with differential collection tube #: 1     Status: Abnormal   Collection Time: 07/16/20 10:05 PM  Result Value Ref Range   Tube # 1    Color, CSF PINK (A) COLORLESS   Appearance, CSF HAZY (A) CLEAR   Supernatant XANTHOCHROMIC    RBC Count, CSF 1,000 (H) 0 /cu mm   WBC, CSF 4 0 - 5 /cu mm   Segmented Neutrophils-CSF RARE 0 - 6 %   Lymphs, CSF FEW 40 - 80 %   Monocyte-Macrophage-Spinal Fluid OCCASIONAL 15  - 45 %   Other Cells, CSF TOO FEW TO COUNT, SMEAR AVAILABLE FOR REVIEW     Comment: Performed at [facility], [address], [place], [place] [address]  CSF cell count with differential collection tube #: 4     Status: Abnormal   Collection Time: 07/16/20 10:05 PM  Result Value Ref Range   Tube # 4    Color, CSF COLORLESS COLORLESS   Appearance, CSF HAZY (A) CLEAR   Supernatant NOT INDICATED    RBC Count, CSF 14 (H) 0 /cu mm   WBC, CSF 1 0 - 5 /cu mm   Segmented Neutrophils-CSF RARE 0 - 6 %   Lymphs, CSF FEW 40 - 80 %   Monocyte-Macrophage-Spinal Fluid OCCASIONAL 15 - 45 %   Other Cells, CSF TOO FEW TO COUNT, SMEAR AVAILABLE FOR REVIEW     Comment: Performed at [facility], [address], [place], [place] [address]  C-reactive protein     Status: Abnormal   Collection Time: 07/16/20 11:51 PM  Result Value Ref Range   CRP 1.0 (H) <1.0 mg/dL    Comment: Performed at [facility], [address], [place], [place] [address]  Vitamin B12     Status: None   Collection Time: 07/16/20 11:51 PM  Result Value Ref Range   Vitamin B-12 317 180 - 914 pg/mL    Comment: Performed at [facility], [address], [place], [place] [address]  TSH     Status: None   Collection Time: 07/16/20 11:51 PM  Result Value Ref Range   TSH 2.042 0.350 - 4.500 uIU/mL  Comment: Performed by a 3rd Generation assay with a functional sensitivity of <=0.01 uIU/mL. Performed at [facility], [address], [place], [place] [address]   Folate     Status: Abnormal   Collection Time: 07/16/20 11:51 PM  Result Value Ref Range   Folate 5.1 (L) >5.9 ng/mL    Comment: Performed at [facility], [address], [place], [place] [address]  Ammonia     Status: None   Collection Time: 07/17/20  4:32 AM  Result Value Ref Range   Ammonia 35 9 - 35 umol/L    Comment: Performed at [facility], [address] [place], [place] [address]  Rapid urine drug screen (hospital  performed)     Status: Abnormal   Collection Time: 07/17/20  5:06 AM  Result Value Ref Range   Opiates NONE DETECTED NONE DETECTED   Cocaine NONE DETECTED NONE DETECTED   Benzodiazepines NONE DETECTED NONE DETECTED   Amphetamines NONE DETECTED NONE DETECTED   Tetrahydrocannabinol POSITIVE (A) NONE DETECTED   Barbiturates NONE DETECTED NONE DETECTED    Comment: (NOTE) DRUG SCREEN FOR MEDICAL PURPOSES ONLY.  IF CONFIRMATION IS NEEDED FOR ANY PURPOSE, NOTIFY LAB WITHIN 5 DAYS.  LOWEST DETECTABLE LIMITS FOR URINE DRUG SCREEN Drug Class                     Cutoff (ng/mL) Amphetamine and metabolites    1000 Barbiturate and metabolites    200 Benzodiazepine                 200 Tricyclics and metabolites     300 Opiates and metabolites        300 Cocaine and metabolites        300 THC                            50 Performed at [facility], [address], [place], [place] [address]   Urinalysis, Complete w Microscopic Urine, Clean Catch     Status: Abnormal   Collection Time: 07/17/20  5:09 AM  Result Value Ref Range   Color, Urine YELLOW YELLOW   APPearance CLOUDY (A) CLEAR   Specific Gravity, Urine 1.027 1.005 - 1.030   pH 6.0 5.0 - 8.0   Glucose, UA NEGATIVE NEGATIVE mg/dL   Hgb urine dipstick NEGATIVE NEGATIVE   Bilirubin Urine NEGATIVE NEGATIVE   Ketones, ur NEGATIVE NEGATIVE mg/dL   Protein, ur NEGATIVE NEGATIVE mg/dL   Nitrite NEGATIVE NEGATIVE   Leukocytes,Ua NEGATIVE NEGATIVE   RBC / HPF 0-5 0 - 5 RBC/hpf   WBC, UA 0-5 0 - 5 WBC/hpf   Bacteria, UA RARE (A) NONE SEEN   Mucus PRESENT     Comment: Performed at [facility], [address], [place], [place] [address]    Recent Results (from the past 240 hour(s))  Resp Panel by RT-PCR (Flu A&B, Covid) Nasopharyngeal Swab     Status: None   Collection Time: 07/16/20 10:00 PM   Specimen: Nasopharyngeal Swab; Nasopharyngeal(NP) swabs in vial transport medium  Result Value Ref Range Status   SARS Coronavirus  2 by RT PCR NEGATIVE NEGATIVE Final    Comment: (NOTE) SARS-CoV-2 target nucleic acids are NOT DETECTED.  The SARS-CoV-2 RNA is generally detectable in upper respiratory specimens during the acute phase of infection. The lowest concentration of SARS-CoV-2 viral copies this assay can detect is 138  copies/mL. A negative result does not preclude SARS-Cov-2 infection and should not be used as the sole basis for treatment or other patient management decisions. A negative result may occur with  improper specimen collection/handling, submission of specimen other than nasopharyngeal swab, presence of viral mutation(s) within the areas targeted by this assay, and inadequate number of viral copies(<138 copies/mL). A negative result must be combined with clinical observations, patient history, and epidemiological information. The expected result is Negative.  Fact Sheet for Patients:  BloggerCourse.com  Fact Sheet for Healthcare Providers:  SeriousBroker.it  This test is no t yet approved or cleared by the [place] FDA and  has been authorized for detection and/or diagnosis of SARS-CoV-2 by FDA under an Emergency Use Authorization (EUA). This EUA will remain  in effect (meaning this test can be used) for the duration of the COVID-19 declaration under Section 564(b)(1) of the Act, 21 U.S.C.section 360bbb-3(b)(1), unless the authorization is terminated  or revoked sooner.       Influenza A by PCR NEGATIVE NEGATIVE Final   Influenza B by PCR NEGATIVE NEGATIVE Final    Comment: (NOTE) The Xpert Xpress SARS-CoV-2/FLU/RSV plus assay is intended as an aid in the diagnosis of influenza from Nasopharyngeal swab specimens and should not be used as a sole basis for treatment. Nasal washings and aspirates are unacceptable for Xpert Xpress SARS-CoV-2/FLU/RSV testing.  Fact Sheet for Patients: BloggerCourse.com  Fact  Sheet for Healthcare Providers: SeriousBroker.it  This test is not yet approved or cleared by the [place] FDA and has been authorized for detection and/or diagnosis of SARS-CoV-2 by FDA under an Emergency Use Authorization (EUA). This EUA will remain in effect (meaning this test can be used) for the duration of the COVID-19 declaration under Section 564(b)(1) of the Act, 21 U.S.C. section 360bbb-3(b)(1), unless the authorization is terminated or revoked.  Performed at [facility], [address], [place], [place] [address]     Lipid Panel No results for input(s): CHOL, TRIG, HDL, CHOLHDL, VLDL, LDLCALC in the last 72 hours.  Studies/Results: DG Chest 2 View  Result Date: 07/17/2020 CLINICAL DATA:  Pt reports he is having approx 30 second intervals of forgetting what he is saying or what is happening. Also reports intermittent dizziness. States symptoms started at 6pm EXAM: CHEST - 2 VIEW COMPARISON:  Chest x-ray 06/27/2005 FINDINGS: The heart size and mediastinal contours are within normal limits. No focal consolidation. No pulmonary edema. No pleural effusion. No pneumothorax. No acute osseous abnormality. IMPRESSION: No active cardiopulmonary disease. Electronically Signed   By: Tish Frederickson M.D.   On: 07/17/2020 00:56   CT HEAD WO CONTRAST  Result Date: 07/16/2020 CLINICAL DATA:  Neuro deficit, acute, stroke suspected EXAM: CT HEAD WITHOUT CONTRAST TECHNIQUE: Contiguous axial images were obtained from the base of the skull through the vertex without intravenous contrast. COMPARISON:  06/27/2005 FINDINGS: Brain: There is no mass, hemorrhage or extra-axial collection. The size and configuration of the ventricles and extra-axial CSF spaces are normal. The brain parenchyma is normal, without acute or chronic infarction. Vascular: No abnormal hyperdensity of the major intracranial arteries or dural venous sinuses. No intracranial atherosclerosis. Skull:  The visualized skull base, calvarium and extracranial soft tissues are normal. Sinuses/Orbits: No fluid levels or advanced mucosal thickening of the visualized paranasal sinuses. No mastoid or middle ear effusion. The orbits are normal. IMPRESSION: Normal head CT. Electronically Signed   By: Deatra Robinson M.D.   On: 07/16/2020 19:58   MR BRAIN W  WO CONTRAST  Result Date: 07/16/2020 CLINICAL DATA:  Delirium EXAM: MRI HEAD WITHOUT AND WITH CONTRAST TECHNIQUE: Multiplanar, multiecho pulse sequences of the brain and surrounding structures were obtained without and with intravenous contrast. CONTRAST:  42mL GADAVIST GADOBUTROL 1 MMOL/ML IV SOLN COMPARISON:  None. FINDINGS: Brain: No acute infarct, mass effect or extra-axial collection. No acute or chronic hemorrhage. Normal white matter signal, parenchymal volume and CSF spaces. The midline structures are normal. There is no abnormal contrast enhancement. Vascular: Major flow voids are preserved. Skull and upper cervical spine: Normal calvarium and skull base. Visualized upper cervical spine and soft tissues are normal. Sinuses/Orbits:No paranasal sinus fluid levels or advanced mucosal thickening. No mastoid or middle ear effusion. Normal orbits. IMPRESSION: Normal brain MRI. Electronically Signed   By: Deatra Robinson M.D.   On: 07/16/2020 21:14    Medications:  No current facility-administered medications on file prior to encounter.   Current Outpatient Medications on File Prior to Encounter  Medication Sig Dispense Refill  . HYDROcodone-acetaminophen (NORCO/VICODIN) 5-325 MG per tablet Take 1-2 tablets by mouth every 4 (four) hours as needed. (Patient not taking: No sig reported) 8 tablet 0    Scheduled: . ciprofloxacin  2 drop Both Eyes Q2H while awake   Followed by  . [START ON 07/18/2020] ciprofloxacin  2 drop Both Eyes Q4H while awake  . vitamin B-12  2,000 mcg Oral Daily   Continuous: . lactated ringers 125 mL/hr at 07/17/20 0121     Assessment: 40 year old male with sudden onset of altered mental status and anterograde memory loss with repetitive questions regarding the same information. He had multiple episodes of memory lapse, with inability to recall what occurred several minutes prior. On initial exam Saturday night, he was very tangential and wordy in his speech which is out of character, and also had some hurried speech, again out of character for him. MRI of the brain with and without contrast was normal. CSF also normal. Covid and influenza A/B negative. Has bacterial conjunctivitis from extensive uninterrupted wearing of his contact lenses.  - No evidence for a metabolic encephalopathy on labs. B12 is in the low range of normal. TSH is normal. However, toxicology screen came back positive for THC.  - Exam this AM is normal. The patient still feels mildly symptomatic, stating that at times he is requiring more time to express himself verbally and come up with the words to say.  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 No seizures or epileptiform discharges were seen throughout the recording.  Recommendations: - CSF HSV PCR, gram stain, CSF culture, autoimmune encephalitis panel are pending. Given that delta-8 THC intoxication is the most likely component of the DDx, the above labs can be followed up as an outpatient - Neurologically appears stable for discharge.  - Will need outpatient Neurology follow up.    LOS: 0 days   @Electronically  signed: Dr. 07/17/2020  7:17 AM

## 2020-07-17 NOTE — H&P (Signed)
History and Physical    NOWELL SITES TTS:[id number] DOB: [birth year] DOA: 07/16/2020  PCP: No primary care provider on file.  Patient coming from: Home   Chief Complaint:  Chief Complaint  Patient presents with  . Dizziness     HPI:    40 year old male with past medical history of migraine headaches who presents to [facility] emergency department with waxing and waning slurred speech forgetfulness and odd behavior.  Patient is an extremely poor historian at this point and therefore the majority the history is been obtained from the wife who was at the bedside as well as review of emergency department and neurology notes.  According to the wife, patient was last known to be normal around 4:30 PM the afternoon of 5/7.  Patient was in the process of taking his daughter to the pharmacy to get the vaccination.  Patient suddenly felt unwell and proceeded to call his wife to ask her to come pick him up.  Wife notes that patient was not acting himself, seemingly quite forgetful with atypical speech.  Wife reports that patient has since been asking her the same questions over and over suggesting significant forgetfulness.  Patient denies headache, focal weakness, facial droop.  No specific slurred speech but there is significant difficulty with word finding at times.  Patient denies fevers, sick contacts, recent camping or recent sick contacts with confirmed COVID-19 infection.  Patient did travel to [facility] approximately 2 months ago and did travel to [place] towards beginning of the year.  With patient's odd behavior and forgetfulness as noted above patient was brought into [facility] emergency department for evaluation.  Of note, patient has additionally been experiencing significant discomfort and drainage from both of his eyes over the past 24 hours.  Patient admits to wearing his contacts for approximately 100 days prior to the onset of symptoms.  Finally, patient is also  been complaining of a approximate month history of cough that has been intermittently productive although patient cannot recount what color sputum he has had.  Patient states that his cough has "been getting better" although during my interview with him cough sounds quite significant.  Denies any associated shortness of breath or chest pain.  Upon evaluation in the emergency department, case was discussed between emergency department provider and Dr. Jerrell Belfast with neurology recommended calling a code stroke.  Patient underwent rapid evaluation by neurology as well as CT imaging of the head and MRI imaging of the brain.  Imaging revealed no acute disease or evidence of stroke.  At the advisement of neurology, patient also underwent lumbar puncture.  The hospitalist group has now been called to assess the patient for admission to the hospital.  Review of Systems:   Review of Systems  Unable to perform ROS: Mental status change    Past Medical History:  Diagnosis Date  . Nicotine dependence, cigarettes, uncomplicated 07/17/2020    Past Surgical History:  Procedure Laterality Date  . ANTERIOR CRUCIATE LIGAMENT REPAIR  2010     reports that he has been smoking cigarettes and e-cigarettes. He has never used smokeless tobacco. He reports current alcohol use. He reports that he does not use drugs.  No Known Allergies  Family History  Problem Relation Age of Onset  . Other Mother      Prior to Admission medications   Medication Sig Start Date End Date Taking? Authorizing Provider  HYDROcodone-acetaminophen (NORCO/VICODIN) 5-325 MG per tablet Take 1-2 tablets by mouth every 4 (four)  hours as needed. Patient not taking: No sig reported 07/24/14   Trixie Dredge, [place]    Physical Exam: Vitals:   07/16/20 2055 07/16/20 2100 07/16/20 2115 07/16/20 2300  BP: 135/79 130/80 127/79 135/76  Pulse: 65 87 85 76  Resp: (!) 22 17 18  (!) 22  Temp:      SpO2: 96% 96% 97% 95%    Constitutional: Lethargic  but arousable, oriented x3, no associated distress.   Skin: no rashes, no lesions, somewhat poor skin turgor noted. Eyes: Extensive injection noted of the sclera bilaterally with significant amount of purulent drainage coming from the right eye and a scant amount of drainage coming from the left.  The pupils are equally reactive to light.  No evidence of scleral icterus or conjunctival pallor.  ENMT: Slightly dry mucous membranes noted.  Posterior pharynx clear of any exudate or lesions.   Neck: normal, supple, no masses, no thyromegaly.  No evidence of jugular venous distension.   Respiratory: Scattered rhonchi bilaterally without evidence of wheezing or rales. Normal respiratory effort. No accessory muscle use.  Cardiovascular: Regular rate and rhythm, no murmurs / rubs / gallops. No extremity edema. 2+ pedal pulses. No carotid bruits.  Chest:   Nontender without crepitus or deformity.   Back:   Nontender without crepitus or deformity. Abdomen: Abdomen is soft and nontender.  No evidence of intra-abdominal masses.  Positive bowel sounds noted in all quadrants.   Musculoskeletal: No joint deformity upper and lower extremities. Good ROM, no contractures. Normal muscle tone.  Neurologic: Patient is quite lethargic but arousable and oriented x3 after much delay.  Patient does intermittently follow commands.  Patient is notably forgetful throughout the interview.  CN 2-12 grossly intact. Sensation intact.  Patient moving all 4 extremities spontaneously.  Patient is following all commands.  Patient is responsive to verbal stimuli.   Psychiatric: Patient exhibits normal mood with appropriate affect.  Patient seems to possess insight as to their current situation.     Labs on Admission: I have personally reviewed following labs and imaging studies -   CBC: Recent Labs  Lab 07/16/20 1856 07/16/20 1857  WBC 9.7  --   NEUTROABS 4.9  --   HGB 14.7 14.6  HCT 45.1 43.0  MCV 94.4  --   PLT 260  --     Basic Metabolic Panel: Recent Labs  Lab 07/16/20 1856 07/16/20 1857  NA 140 144  K 3.7 3.6  CL 108 105  CO2 26  --   GLUCOSE 109* 105*  BUN 16 19  CREATININE 1.07 1.00  CALCIUM 9.3  --    GFR: CrCl cannot be calculated (Unknown ideal weight.). Liver Function Tests: Recent Labs  Lab 07/16/20 1856  AST 20  ALT 31  ALKPHOS 92  BILITOT 0.6  PROT 7.2  ALBUMIN 4.4   No results for input(s): LIPASE, AMYLASE in the last 168 hours. No results for input(s): AMMONIA in the last 168 hours. Coagulation Profile: Recent Labs  Lab 07/16/20 1856  INR 1.0   Cardiac Enzymes: No results for input(s): CKTOTAL, CKMB, CKMBINDEX, TROPONINI in the last 168 hours. BNP (last 3 results) No results for input(s): PROBNP in the last 8760 hours. HbA1C: No results for input(s): HGBA1C in the last 72 hours. CBG: No results for input(s): GLUCAP in the last 168 hours. Lipid Profile: No results for input(s): CHOL, HDL, LDLCALC, TRIG, CHOLHDL, LDLDIRECT in the last 72 hours. Thyroid Function Tests: No results for input(s): TSH, T4TOTAL, FREET4, T3FREE,  THYROIDAB in the last 72 hours. Anemia Panel: No results for input(s): VITAMINB12, FOLATE, FERRITIN, TIBC, IRON, RETICCTPCT in the last 72 hours. Urine analysis: No results found for: COLORURINE, APPEARANCEUR, LABSPEC, PHURINE, GLUCOSEU, HGBUR, BILIRUBINUR, KETONESUR, PROTEINUR, UROBILINOGEN, NITRITE, LEUKOCYTESUR  Radiological Exams on Admission - Personally Reviewed: CT HEAD WO CONTRAST  Result Date: 07/16/2020 CLINICAL DATA:  Neuro deficit, acute, stroke suspected EXAM: CT HEAD WITHOUT CONTRAST TECHNIQUE: Contiguous axial images were obtained from the base of the skull through the vertex without intravenous contrast. COMPARISON:  06/27/2005 FINDINGS: Brain: There is no mass, hemorrhage or extra-axial collection. The size and configuration of the ventricles and extra-axial CSF spaces are normal. The brain parenchyma is normal, without acute or  chronic infarction. Vascular: No abnormal hyperdensity of the major intracranial arteries or dural venous sinuses. No intracranial atherosclerosis. Skull: The visualized skull base, calvarium and extracranial soft tissues are normal. Sinuses/Orbits: No fluid levels or advanced mucosal thickening of the visualized paranasal sinuses. No mastoid or middle ear effusion. The orbits are normal. IMPRESSION: Normal head CT. Electronically Signed   By: Deatra RobinsonKevin  Herman M.D.   On: 07/16/2020 19:58   MR BRAIN W WO CONTRAST  Result Date: 07/16/2020 CLINICAL DATA:  Delirium EXAM: MRI HEAD WITHOUT AND WITH CONTRAST TECHNIQUE: Multiplanar, multiecho pulse sequences of the brain and surrounding structures were obtained without and with intravenous contrast. CONTRAST:  10mL GADAVIST GADOBUTROL 1 MMOL/ML IV SOLN COMPARISON:  None. FINDINGS: Brain: No acute infarct, mass effect or extra-axial collection. No acute or chronic hemorrhage. Normal white matter signal, parenchymal volume and CSF spaces. The midline structures are normal. There is no abnormal contrast enhancement. Vascular: Major flow voids are preserved. Skull and upper cervical spine: Normal calvarium and skull base. Visualized upper cervical spine and soft tissues are normal. Sinuses/Orbits:No paranasal sinus fluid levels or advanced mucosal thickening. No mastoid or middle ear effusion. Normal orbits. IMPRESSION: Normal brain MRI. Electronically Signed   By: Deatra RobinsonKevin  Herman M.D.   On: 07/16/2020 21:14    EKG: Personally reviewed.  Rhythm is tachycardia with heart rate of 104 bpm .  No dynamic ST segment changes appreciated.  Assessment/Plan Principal Problem:   Acute encephalopathy   Patient presenting with rather rapid onset of mild confusion, forgetfulness and difficulty with word finding  Patient is an extremely poor historian at this point and therefore symptoms may actually have been ongoing longer than reported  Patient is denying drug abuse or new  prescription medications  Neuroimaging currently negative for any evidence of stroke or acute process, lumbar puncture is currently pending which will include an autoimmune encephalitis panel  Based on patient's presentation I believe the patient needs a thorough work-up to look for causes of underlying encephalopathy as well as infectious work-up.  Ammonia, RPR, vitamin B12, folate, TSH have been ordered  Obtaining chest x-ray and urinalysis -chest x-ray in particular due to patient's 10739-month history of ongoing cough  Initiating antibiotic drops for substantial bacterial conjunctivitis of both eyes although I do not believe that this would cause the patient to develop encephalopathy  EEG has been ordered for the morning per neurology recommendations  Hydrating patient with intravenous isotonic fluids  Monitoring overnight and will coordinate with neurology in the morning  Active Problems:   Bacterial conjunctivitis of both eyes   Significant bacterial conjunctivitis noted on examination with significant amounts of purulent drainage coming from the right eye and scant amount of drainage coming from the left.  Patient reports wearing his contact lenses for at  least 100 days and has worn his contact lenses for almost a year straight in the past  Presence of longstanding contact use placing the patient at extremely high risk of pseudomonal infection and particularly at high risk of the development of keratitis  Ciprofloxacin eyedrops have been initiated  If patient fails to exhibit rapid improvement in symptoms then ophthalmology consult will be needed    Cough   Patient reports 93-month history of ongoing cough with intermittent sputum production  Obtaining chest x-ray to better evaluate  COVID-19 PCR negative    Nicotine dependence, cigarettes, uncomplicated   Counseling on cessation daily   Code Status:  Full code Family Communication: Wife is at bedside who has been  updated on plan of care  Status is: Observation  The patient remains OBS appropriate and will d/c before 2 midnights.  Dispo: The patient is from: Home              Anticipated d/c is to: Home              Patient currently is not medically stable to d/c.   Difficult to place patient No        Marinda Elk MD [facility] Pager [phone]  If 7PM-7AM, please contact night-coverage [email or web address] Use universal [place] password for that web site. If you do not have the password, please call the hospital operator.  07/17/2020, 12:46 AM

## 2020-07-17 NOTE — ED Notes (Signed)
Breakfast tray ordered 

## 2020-07-17 NOTE — ED Notes (Signed)
Neurology at bedside.

## 2020-07-17 NOTE — ED Notes (Signed)
EEG at bedside.

## 2020-07-17 NOTE — Discharge Summary (Signed)
Physician Discharge Summary  Jorge Swanson WUJ:[id number] DOB: 27-Nov-1980 DOA: 07/16/2020  PCP: No primary care provider on file.  Admit date: 07/16/2020 Discharge date: 07/17/2020  Admitted From: Home Disposition:  Home  Recommendations for Outpatient Follow-up:  1. Follow up with PCP in 2-3 weeks  Discharge Condition:Improved CODE STATUS:Full Diet recommendation: Regular   Brief/Interim Summary: 40 year old male with past medical history of migraine headaches who [facility] department with waxing and waning slurred speech forgetfulness and odd behavior. On further questioning, patient recalled ingesting a candy infused with what pt believed was hemp oil. One hour later, pt displayed above symptoms.  Discharge Diagnoses:  Principal Problem:   Acute encephalopathy Active Problems:   Bacterial conjunctivitis of both eyes   Cough   Nicotine dependence, cigarettes, uncomplicated  Principal Problem:   Toxic metabolic Acute encephalopathy likely secondary to accidental drug intoxication with hemp oil product   Patient presenting with rather rapid onset of mild confusion, forgetfulness and difficulty with word finding  Neuroimaging currently negative for any evidence of stroke or acute process, lumbar puncture performed by Neurology, found to be unremarkable  Ammonia, vitamin B12, folate, TSH unremarkable  EEG was performed and reviewed, no epileptiform changes, unchanged  By the following morning, patient's symptoms improved  Discussed with Neurology. Per Neurology, accidental drug intoxication with a hemp oil product is the most likely etiology for his presentation.  Recommend avoiding further hemp oil containing products in the future  Active Problems:   Bacterial conjunctivitis of both eyes   Significant bacterial conjunctivitis noted on examination with significant amounts of purulent drainage coming from the right eye and scant amount of  drainage coming from the left.  Patient reports wearing his contact lenses for at least 100 days and has worn his contact lenses for almost a year straight in the past  Ciprofloxacin eyedrops have been initiated    Cough   Patient reports 7032-month history of ongoing cough with intermittent sputum production  CXR reviewed, neg    Nicotine dependence, cigarettes, uncomplicated   Recommend cessation   Discharge Instructions   Allergies as of 07/17/2020   No Known Allergies     Medication List    STOP taking these medications   HYDROcodone-acetaminophen 5-325 MG tablet Commonly known as: NORCO/VICODIN     TAKE these medications   ciprofloxacin 0.3 % ophthalmic solution Commonly known as: CILOXAN Place 2 drops into both eyes every 4 (four) hours while awake. Administer 1 drop, every 2 hours, while awake, for 2 days. Then 1 drop, every 4 hours, while awake, for the next 5 days. Start taking on: Jul 18, 2020       No Known Allergies  Consultations:  Neurology  Procedures/Studies: DG Chest 2 View  Result Date: 07/17/2020 CLINICAL DATA:  Pt reports he is having approx 30 second intervals of forgetting what he is saying or what is happening. Also reports intermittent dizziness. States symptoms started at 6pm EXAM: CHEST - 2 VIEW COMPARISON:  Chest x-ray 06/27/2005 FINDINGS: The heart size and mediastinal contours are within normal limits. No focal consolidation. No pulmonary edema. No pleural effusion. No pneumothorax. No acute osseous abnormality. IMPRESSION: No active cardiopulmonary disease. Electronically Signed   By: Tish FredericksonMorgane  Naveau M.D.   On: 07/17/2020 00:56   CT HEAD WO CONTRAST  Result Date: 07/16/2020 CLINICAL DATA:  Neuro deficit, acute, stroke suspected EXAM: CT HEAD WITHOUT CONTRAST TECHNIQUE: Contiguous axial images were obtained from the base of the skull through the  vertex without intravenous contrast. COMPARISON:  06/27/2005 FINDINGS: Brain: There is no  mass, hemorrhage or extra-axial collection. The size and configuration of the ventricles and extra-axial CSF spaces are normal. The brain parenchyma is normal, without acute or chronic infarction. Vascular: No abnormal hyperdensity of the major intracranial arteries or dural venous sinuses. No intracranial atherosclerosis. Skull: The visualized skull base, calvarium and extracranial soft tissues are normal. Sinuses/Orbits: No fluid levels or advanced mucosal thickening of the visualized paranasal sinuses. No mastoid or middle ear effusion. The orbits are normal. IMPRESSION: Normal head CT. Electronically Signed   By: Deatra Robinson M.D.   On: 07/16/2020 19:58   MR BRAIN W WO CONTRAST  Result Date: 07/16/2020 CLINICAL DATA:  Delirium EXAM: MRI HEAD WITHOUT AND WITH CONTRAST TECHNIQUE: Multiplanar, multiecho pulse sequences of the brain and surrounding structures were obtained without and with intravenous contrast. CONTRAST:  4mL GADAVIST GADOBUTROL 1 MMOL/ML IV SOLN COMPARISON:  None. FINDINGS: Brain: No acute infarct, mass effect or extra-axial collection. No acute or chronic hemorrhage. Normal white matter signal, parenchymal volume and CSF spaces. The midline structures are normal. There is no abnormal contrast enhancement. Vascular: Major flow voids are preserved. Skull and upper cervical spine: Normal calvarium and skull base. Visualized upper cervical spine and soft tissues are normal. Sinuses/Orbits:No paranasal sinus fluid levels or advanced mucosal thickening. No mastoid or middle ear effusion. Normal orbits. IMPRESSION: Normal brain MRI. Electronically Signed   By: Deatra Robinson M.D.   On: 07/16/2020 21:14   EEG adult  Result Date: 07/17/2020 Charlsie Quest, MD     07/17/2020 10:34 AM Patient Name: Jorge Swanson MRN: [id number] Epilepsy Attending: Charlsie Quest Referring Physician/Provider: Dr Shauna Hugh Date: 07/17/2020 Duration: 28.10 mins Patient history: 40 year old male with sudden onset  of altered mental status and anterograde memory loss with repetitive questions regarding the same information. He had multiple episodes of memory lapse, with inability to recall what occurred several minutes prior. EEG to evaluate for seizure. Level of alertness: Awake, asleep AEDs during EEG study: None Technical aspects: This EEG study was done with scalp electrodes positioned according to the 10-20 International system of electrode placement. Electrical activity was acquired at a sampling rate of 500Hz  and reviewed with a high frequency filter of 70Hz  and a low frequency filter of 1Hz . EEG data were recorded continuously and digitally stored. Description: The posterior dominant rhythm consists of 11 Hz activity of moderate voltage (25-35 uV) seen predominantly in posterior head regions, symmetric and reactive to eye opening and eye closing. Sleep was characterized by vertex waves, sleep spindles (12 to 14 Hz), maximal frontocentral region. Hyperventilation and photic stimulation were not performed.   IMPRESSION: This study is within normal limits. No seizures or epileptiform discharges were seen throughout the recording. If suspicion for ictal-interictal activity remains a concern, a prolonged study should be considered. Priyanka     Subjective: Reports feeling better   Discharge Exam: Vitals:   07/17/20 1130 07/17/20 1200  BP: 117/84 125/80  Pulse: 70 73  Resp: 20 20  Temp:    SpO2: 94% 94%   Vitals:   07/17/20 1030 07/17/20 1100 07/17/20 1130 07/17/20 1200  BP: (!) 160/99 (!) 162/97 117/84 125/80  Pulse: 68 68 70 73  Resp: 18 19 20 20   Temp:      SpO2: 96% 96% 94% 94%   General: Pt is alert, awake, not in acute distress Cardiovascular: RRR, S1/S2 +, no rubs, no gallops Respiratory: CTA bilaterally,  no wheezing, no rhonchi Abdominal: Soft, NT, ND, bowel sounds + Extremities: no edema, no cyanosis  The results of significant diagnostics from this hospitalization (including  imaging, microbiology, ancillary and laboratory) are listed below for reference.     Microbiology: Recent Results (from the past 240 hour(s))  Resp Panel by RT-PCR (Flu A&B, Covid) Nasopharyngeal Swab     Status: None   Collection Time: 07/16/20 10:00 PM   Specimen: Nasopharyngeal Swab; Nasopharyngeal(NP) swabs in vial transport medium  Result Value Ref Range Status   SARS Coronavirus 2 by RT PCR NEGATIVE NEGATIVE Final    Comment: (NOTE) SARS-CoV-2 target nucleic acids are NOT DETECTED.  The SARS-CoV-2 RNA is generally detectable in upper respiratory specimens during the acute phase of infection. The lowest concentration of SARS-CoV-2 viral copies this assay can detect is 138 copies/mL. A negative result does not preclude SARS-Cov-2 infection and should not be used as the sole basis for treatment or other patient management decisions. A negative result may occur with  improper specimen collection/handling, submission of specimen other than nasopharyngeal swab, presence of viral mutation(s) within the areas targeted by this assay, and inadequate number of viral copies(<138 copies/mL). A negative result must be combined with clinical observations, patient history, and epidemiological information. The expected result is Negative.  Fact Sheet for Patients:  BloggerCourse.com  Fact Sheet for Healthcare Providers:  SeriousBroker.it  This test is no t yet approved or cleared by the [place] FDA and  has been authorized for detection and/or diagnosis of SARS-CoV-2 by FDA under an Emergency Use Authorization (EUA). This EUA will remain  in effect (meaning this test can be used) for the duration of the COVID-19 declaration under Section 564(b)(1) of the Act, 21 U.S.C.section 360bbb-3(b)(1), unless the authorization is terminated  or revoked sooner.       Influenza A by PCR NEGATIVE NEGATIVE Final   Influenza B by PCR NEGATIVE  NEGATIVE Final    Comment: (NOTE) The Xpert Xpress SARS-CoV-2/FLU/RSV plus assay is intended as an aid in the diagnosis of influenza from Nasopharyngeal swab specimens and should not be used as a sole basis for treatment. Nasal washings and aspirates are unacceptable for Xpert Xpress SARS-CoV-2/FLU/RSV testing.  Fact Sheet for Patients: BloggerCourse.com  Fact Sheet for Healthcare Providers: SeriousBroker.it  This test is not yet approved or cleared by the [place] FDA and has been authorized for detection and/or diagnosis of SARS-CoV-2 by FDA under an Emergency Use Authorization (EUA). This EUA will remain in effect (meaning this test can be used) for the duration of the COVID-19 declaration under Section 564(b)(1) of the Act, 21 U.S.C. section 360bbb-3(b)(1), unless the authorization is terminated or revoked.  Performed at [facility], [address] [place], [place] [address]      Labs: BNP (last 3 results) No results for input(s): BNP in the last 8760 hours. Basic Metabolic Panel: Recent Labs  Lab 07/16/20 1856 07/16/20 1857  NA 140 144  K 3.7 3.6  CL 108 105  CO2 26  --   GLUCOSE 109* 105*  BUN 16 19  CREATININE 1.07 1.00  CALCIUM 9.3  --    Liver Function Tests: Recent Labs  Lab 07/16/20 1856  AST 20  ALT 31  ALKPHOS 92  BILITOT 0.6  PROT 7.2  ALBUMIN 4.4   No results for input(s): LIPASE, AMYLASE in the last 168 hours. Recent Labs  Lab 07/17/20 0432  AMMONIA 35   CBC: Recent Labs  Lab 07/16/20 1856  07/16/20 1857  WBC 9.7  --   NEUTROABS 4.9  --   HGB 14.7 14.6  HCT 45.1 43.0  MCV 94.4  --   PLT 260  --    Cardiac Enzymes: No results for input(s): CKTOTAL, CKMB, CKMBINDEX, TROPONINI in the last 168 hours. BNP: Invalid input(s): POCBNP CBG: No results for input(s): GLUCAP in the last 168 hours. D-Dimer No results for input(s): DDIMER in the last 72 hours. Hgb A1c No  results for input(s): HGBA1C in the last 72 hours. Lipid Profile No results for input(s): CHOL, HDL, LDLCALC, TRIG, CHOLHDL, LDLDIRECT in the last 72 hours. Thyroid function studies Recent Labs    07/16/20 2351  TSH 2.042   Anemia work up Recent Labs    07/16/20 2351  VITAMINB12 317  FOLATE 5.1*   Urinalysis    Component Value Date/Time   COLORURINE YELLOW 07/17/2020 0509   APPEARANCEUR CLOUDY (A) 07/17/2020 0509   LABSPEC 1.027 07/17/2020 0509   PHURINE 6.0 07/17/2020 0509   GLUCOSEU NEGATIVE 07/17/2020 0509   HGBUR NEGATIVE 07/17/2020 0509   BILIRUBINUR NEGATIVE 07/17/2020 0509   KETONESUR NEGATIVE 07/17/2020 0509   PROTEINUR NEGATIVE 07/17/2020 0509   NITRITE NEGATIVE 07/17/2020 0509   LEUKOCYTESUR NEGATIVE 07/17/2020 0509   Sepsis Labs Invalid input(s): PROCALCITONIN,  WBC,  LACTICIDVEN Microbiology Recent Results (from the past 240 hour(s))  Resp Panel by RT-PCR (Flu A&B, Covid) Nasopharyngeal Swab     Status: None   Collection Time: 07/16/20 10:00 PM   Specimen: Nasopharyngeal Swab; Nasopharyngeal(NP) swabs in vial transport medium  Result Value Ref Range Status   SARS Coronavirus 2 by RT PCR NEGATIVE NEGATIVE Final    Comment: (NOTE) SARS-CoV-2 target nucleic acids are NOT DETECTED.  The SARS-CoV-2 RNA is generally detectable in upper respiratory specimens during the acute phase of infection. The lowest concentration of SARS-CoV-2 viral copies this assay can detect is 138 copies/mL. A negative result does not preclude SARS-Cov-2 infection and should not be used as the sole basis for treatment or other patient management decisions. A negative result may occur with  improper specimen collection/handling, submission of specimen other than nasopharyngeal swab, presence of viral mutation(s) within the areas targeted by this assay, and inadequate number of viral copies(<138 copies/mL). A negative result must be combined with clinical observations, patient  history, and epidemiological information. The expected result is Negative.  Fact Sheet for Patients:  BloggerCourse.com  Fact Sheet for Healthcare Providers:  SeriousBroker.it  This test is no t yet approved or cleared by the [place] FDA and  has been authorized for detection and/or diagnosis of SARS-CoV-2 by FDA under an Emergency Use Authorization (EUA). This EUA will remain  in effect (meaning this test can be used) for the duration of the COVID-19 declaration under Section 564(b)(1) of the Act, 21 U.S.C.section 360bbb-3(b)(1), unless the authorization is terminated  or revoked sooner.       Influenza A by PCR NEGATIVE NEGATIVE Final   Influenza B by PCR NEGATIVE NEGATIVE Final    Comment: (NOTE) The Xpert Xpress SARS-CoV-2/FLU/RSV plus assay is intended as an aid in the diagnosis of influenza from Nasopharyngeal swab specimens and should not be used as a sole basis for treatment. Nasal washings and aspirates are unacceptable for Xpert Xpress SARS-CoV-2/FLU/RSV testing.  Fact Sheet for Patients: BloggerCourse.com  Fact Sheet for Healthcare Providers: SeriousBroker.it  This test is not yet approved or cleared by the [place] FDA and has been authorized for detection and/or diagnosis of SARS-CoV-2 by  FDA under an Emergency Use Authorization (EUA). This EUA will remain in effect (meaning this test can be used) for the duration of the COVID-19 declaration under Section 564(b)(1) of the Act, 21 U.S.C. section 360bbb-3(b)(1), unless the authorization is terminated or revoked.  Performed at [facility], [address] [place], [place] [address]    Time spent:  SIGNED:   Rickey Barbara, MD  [facility] 07/17/2020, 12:55 PM  If 7PM-7AM, please contact night-coverage

## 2020-07-17 NOTE — Procedures (Signed)
Patient Name: Jorge Swanson  MRN: [id number]  Epilepsy Attending: Charlsie Quest  Referring Physician/Provider: Dr Shauna Hugh Date: 07/17/2020 Duration: 28.10 mins  Patient history: 40 year old male with sudden onset of altered mental status and anterograde memory loss with repetitive questions regarding the same information. He had multiple episodes of memory lapse, with inability to recall what occurred several minutes prior. EEG to evaluate for seizure.  Level of alertness: Awake, asleep  AEDs during EEG study: None  Technical aspects: This EEG study was done with scalp electrodes positioned according to the 10-20 International system of electrode placement. Electrical activity was acquired at a sampling rate of 500Hz  and reviewed with a high frequency filter of 70Hz  and a low frequency filter of 1Hz . EEG data were recorded continuously and digitally stored.   Description: The posterior dominant rhythm consists of 11 Hz activity of moderate voltage (25-35 uV) seen predominantly in posterior head regions, symmetric and reactive to eye opening and eye closing. Sleep was characterized by vertex waves, sleep spindles (12 to 14 Hz), maximal frontocentral region. Hyperventilation and photic stimulation were not performed.     IMPRESSION: This study is within normal limits. No seizures or epileptiform discharges were seen throughout the recording.  If suspicion for ictal-interictal activity remains a concern, a prolonged study should be considered.   Otilio Groleau 

## 2020-07-17 NOTE — ED Notes (Signed)
Pt d/c home per MD order. Discharge summary reviewed with pt, pt verbalizes understanding. Hospitalist [place] on phone to answer all patients questions. No s/s of acute distress noted at discharge. Ambulatory off unit. Discharged home with wife.

## 2020-07-17 NOTE — Progress Notes (Signed)
EEG complete - results pending 

## 2020-07-18 LAB — IGG CSF INDEX
Albumin CSF-mCnc: 16 mg/dL (ref 10–45)
Albumin: 4.2 g/dL (ref 4.0–5.0)
CSF IgG Index: 0.5 (ref 0.0–0.7)
IgG (Immunoglobin G), Serum: 916 mg/dL (ref 603–1613)
IgG, CSF: 1.8 mg/dL (ref 0.0–10.3)
IgG/Alb Ratio, CSF: 0.11 (ref 0.00–0.25)

## 2020-07-19 LAB — HSV 1/2 PCR, CSF
HSV-1 DNA: NEGATIVE
HSV-2 DNA: NEGATIVE

## 2020-07-20 LAB — OLIGOCLONAL BANDS, CSF + SERM

## 2020-07-20 LAB — MISC LABCORP TEST (SEND OUT): Labcorp test code: 505265

## 2020-07-20 NOTE — Progress Notes (Signed)
NEUROLOGY CONSULTATION NOTE  Jorge Swanson MRN: [id number] DOB: Apr 10, 1980  Referring provider: Gerhard Munch, MD (ED referral) Primary care provider: No PCP  Reason for consult:  Memory loss/dizziness  Assessment/Plan:   Acute encephalopathy/altered mental status - unclear etiology.  I do not think it was a TIA or migraine.  Atypical seizure or transient global amnesia possible.  1.  I would like to check a 24 hour ambulatory EEG.  Further recommendations pending results.   Subjective:  Jorge Swanson is a 40 year old left-handed male with no significant past medical history who presents for memory loss and dizziness.  History supplemented by hospital records.  CT and MRI of brain personally reviewed.  On 07/16/2020, he was at [facility] taking his daughter for a COVID vaccine shot.  He was standing in line when he suddenly felt faint and zoned out for a moment.  When he sat down by his daughter, he felt like he was nodding off every 30 seconds.  Every 30-45 seconds he would suddenly not remember what he had just said.  He reportedly called his wife 3 separate times having the same conversation.  He had her pick them up.  No nausea, epigastric rising, phantosmia or headache.  Earlier that day, he went to a vape shop to pick up nicotine and ate a chocolate at the front counter displayed for customers.  He went to the ED at [facility] where he underwent workup.  CT and MRI of brain were normal.  Routine awake and asleep EEG was normal.  He underwent LP which demonstrated CSF cell count 1, protein 26, glucose 67, IgG index 0.5, and negative HSV 1/2 PCR.  Serum B12 317, folate 5.1, TSH 2.042, CRP 1, ammonia was 35, negative SARS Coronavirus 2 negative, RPR nonreactive and negative autoimmune encephalitis antibody panel (NMDA, AMPAR1/R2, GABA-B, IFA, LGl-1, CASPR2, DPPX), UA and rapid drug screen was positive for THC and negative for infection.  In-house neurology suspected accidental  delta-8 THC intoxication with hemp oil product was suspected.  Symptoms lasted 12 to 16 hours.  He wasn't feeling ill.  He did report some increased stress.  He took off of work for this past week.  He reports feeling tired and sometimes has brief word-finding difficulty.  Once, he accidentally called his wife by his daughter's name.  He reports remote history of infrequent migraines.  Denies history of seizures. When he was a teenager, he underwent an ablation due to tachycardia.  He does not use cannabis or other drugs.  The positive THC is thought to have been in the chocolate from the vape shop.  His mother had similar symptoms and was found to have a brain lesion suspected to be neurosarcoidosis for which she has previously been treated with steroids.       PAST MEDICAL HISTORY: Past Medical History:  Diagnosis Date  . Nicotine dependence, cigarettes, uncomplicated 07/17/2020    PAST SURGICAL HISTORY: Past Surgical History:  Procedure Laterality Date  . ANTERIOR CRUCIATE LIGAMENT REPAIR  2010    MEDICATIONS: Current Outpatient Medications on File Prior to Visit  Medication Sig Dispense Refill  . ciprofloxacin (CILOXAN) 0.3 % ophthalmic solution Place 2 drops into both eyes every 4 (four) hours while awake. Administer 1 drop, every 2 hours, while awake, for 2 days. Then 1 drop, every 4 hours, while awake, for the next 5 days. 5 mL 0   No current facility-administered medications on file prior to visit.  ALLERGIES: No Known Allergies  FAMILY HISTORY: Family History  Problem Relation Age of Onset  . Other Mother     Objective:  Blood pressure 121/82, pulse 78, height 6\' 4"  (1.93 m), weight 273 lb 9.6 oz (124.1 kg), SpO2 99 %. General: No acute distress.  Patient appears well-groomed.   Head:  Normocephalic/atraumatic Eyes:  fundi examined but not visualized Neck: supple, no paraspinal tenderness, full range of motion Back: No paraspinal tenderness Heart: regular rate and  rhythm Lungs: Clear to auscultation bilaterally. Vascular: No carotid bruits. Neurological Exam: Mental status: alert and oriented to person, place, and time, recent and remote memory intact, fund of knowledge intact, attention and concentration intact, speech fluent and not dysarthric, language intact. Cranial nerves: CN I: not tested CN II: pupils equal, round and reactive to light, visual fields intact CN III, IV, VI:  full range of motion, no nystagmus, no ptosis CN V: facial sensation intact. CN VII: upper and lower face symmetric CN VIII: hearing intact CN IX, X: gag intact, uvula midline CN XI: sternocleidomastoid and trapezius muscles intact CN XII: tongue midline Bulk & Tone: normal, no fasciculations. Motor:  muscle strength 5/5 throughout Sensation:  Pinprick, temperature and vibratory sensation intact. Deep Tendon Reflexes:  2+ throughout,  toes downgoing.   Finger to nose testing:  Without dysmetria.   Heel to shin:  Without dysmetria.   Gait:  Normal station and stride.  Romberg negative.    Thank you for allowing me to take part in the care of this patient.  , DO

## 2020-07-22 ENCOUNTER — Other Ambulatory Visit: Payer: Self-pay

## 2020-07-22 ENCOUNTER — Encounter: Payer: Self-pay | Admitting: Neurology

## 2020-07-22 ENCOUNTER — Ambulatory Visit: Payer: BC Managed Care – PPO | Admitting: Neurology

## 2020-07-22 VITALS — BP 121/82 | HR 78 | Ht 76.0 in | Wt 273.6 lb

## 2020-07-22 DIAGNOSIS — G9340 Encephalopathy, unspecified: Secondary | ICD-10-CM

## 2020-07-22 NOTE — Patient Instructions (Signed)
We will check a 24 hour ambulatory EEG.  If unremarkable, no further workup at this time.  Not sure what happened.  We are evaluating further for seizure.  It may have been an atypical presentation of transient global amnesia.  I do not think it is a stroke/TIA or atypical migraine.

## 2020-07-27 ENCOUNTER — Telehealth: Payer: Self-pay | Admitting: *Deleted

## 2020-07-27 NOTE — Telephone Encounter (Signed)
Returned his call regarding scheduling a 24 hour ambulatory EEG LMOM and offererd  Thursday June1 @12 :00 Wednesday June 8th at 7:30, 9:00 or 11:00 Wednesday June 15 at 7:30, 9:00 or 11:00 I apologized for not taking his call earlier I was with a patient.

## 2021-10-09 ENCOUNTER — Telehealth: Payer: Self-pay | Admitting: Neurology

## 2021-10-09 NOTE — Telephone Encounter (Signed)
The following message was left with [employer] on 10/06/21 at 4:44 PM.  Caller says that she wants to make an appointment for her husband. He was forgetting stuff again and was clammy last night, and he was not speaking clearly. He can't remember what happened last night. Today he is fine. Declined triage.

## 2021-10-20 NOTE — Telephone Encounter (Signed)
Called and left messages with sooner openings.

## 2022-01-01 NOTE — Progress Notes (Signed)
NEUROLOGY FOLLOW UP OFFICE NOTE  Jorge Swanson [id number]  Assessment/Plan:   Questionable episode of confusion.  He does report fatigue and word-finding difficulty, which may be related to stress and fatigue related to work.     1.  Because it has been over a year, will need to repeat a routine EEG first.  If unremarkable, proceed with 24 hour ambulatory EEG.  Further recommendations pending results. 2.  Check B12 level 3.  Further recommendations pending results.     Subjective:  Jorge Swanson is a 41 year old left-handed male with no significant past medical history who follows up for confusion  UPDATE: Last seen for initial consultation in May 2022 for acute confusion.  24 hour ambulatory EEG was ordered but it was never scheduled it and was lost to follow up.  Overall, he reports increased stress and fatigue.  He works 60-70 hours a week and frequently takes the redeye.  Notes he is sleep deprived.  He notes some word-finding difficulty, such as recalling names.  In July, he was talking with his wife who told him that he wasn't making sense.  She was concerned that he was having another spell.  Lasted maybe 30 minutes.  He doesn't think it was similar to his previous event.      HISTORY: On 07/16/2020, he was at [facility] taking his daughter for a COVID vaccine shot.  He was standing in line when he suddenly felt faint and zoned out for a moment.  When he sat down by his daughter, he felt like he was nodding off every 30 seconds.  Every 30-45 seconds he would suddenly not remember what he had just said.  He reportedly called his wife 3 separate times having the same conversation.  He had her pick them up.  No nausea, epigastric rising, phantosmia or headache.  Earlier that day, he went to a vape shop to pick up nicotine and ate a chocolate at the front counter displayed for customers.  He went to the ED at [facility] where he underwent workup.  CT and MRI of brain with and  without contrast were normal.  Routine awake and asleep EEG was normal.  He underwent LP which demonstrated CSF cell count 1, protein 26, glucose 67, IgG index 0.5, and negative HSV 1/2 PCR.  Serum B12 317, folate 5.1, TSH 2.042, CRP 1, ammonia was 35, negative SARS Coronavirus 2 negative, RPR nonreactive and negative autoimmune encephalitis antibody panel (NMDA, AMPAR1/R2, GABA-B, IFA, LGl-1, CASPR2, DPPX), UA and rapid drug screen was positive for THC and negative for infection.  In-house neurology suspected accidental delta-8 THC intoxication with hemp oil product was suspected.  Symptoms lasted 12 to 16 hours.  He wasn't feeling ill.  He did report some increased stress.  He took off of work for this past week.  He reports feeling tired and sometimes has brief word-finding difficulty.  Once, he accidentally called his wife by his daughter's name.   He reports remote history of infrequent migraines.  Denies history of seizures. When he was a teenager, he underwent an ablation due to tachycardia.  He does not use cannabis or other drugs.  The positive THC is thought to have been in the chocolate from the vape shop.  His mother had similar symptoms and was found to have a brain lesion suspected to be neurosarcoidosis for which she has previously been treated with steroids.  PAST MEDICAL HISTORY: Past Medical History:  Diagnosis Date  Nicotine dependence, cigarettes, uncomplicated 08/12/2295    MEDICATIONS: Current Outpatient Medications on File Prior to Visit  Medication Sig Dispense Refill   ciprofloxacin (CILOXAN) 0.3 % ophthalmic solution Place 2 drops into both eyes every 4 (four) hours while awake. Administer 1 drop, every 2 hours, while awake, for 2 days. Then 1 drop, every 4 hours, while awake, for the next 5 days. 5 mL 0   No current facility-administered medications on file prior to visit.    ALLERGIES: No Known Allergies  FAMILY HISTORY: Family History  Problem Relation Age of Onset    Other Mother       Objective:  Blood pressure 123/85, pulse 65, height 6\' 4"  (1.93 m), weight 290 lb 6.4 oz (131.7 kg), SpO2 97 %. General: No acute distress.  Patient appears well-groomed.   Head:  Normocephalic/atraumatic Eyes:  Fundi examined but not visualized Neck: supple, no paraspinal tenderness, full range of motion Heart:  Regular rate and rhythm Lungs:  Clear to auscultation bilaterally Back: No paraspinal tenderness Neurological Exam: alert and oriented to person, place, and time.  Speech fluent and not dysarthric, language intact.  CN II-XII intact. Bulk and tone normal, muscle strength 5/5 throughout.  Sensation to light touch intact.  Deep tendon reflexes 2+ throughout, toes downgoing.  Finger to nose testing intact.  Gait normal, Romberg negative.   Metta Clines, DO

## 2022-01-02 ENCOUNTER — Other Ambulatory Visit (INDEPENDENT_AMBULATORY_CARE_PROVIDER_SITE_OTHER): Payer: 59

## 2022-01-02 ENCOUNTER — Encounter: Payer: Self-pay | Admitting: Neurology

## 2022-01-02 ENCOUNTER — Ambulatory Visit: Payer: 59 | Admitting: Neurology

## 2022-01-02 VITALS — BP 123/85 | HR 65 | Ht 76.0 in | Wt 290.4 lb

## 2022-01-02 DIAGNOSIS — R5383 Other fatigue: Secondary | ICD-10-CM

## 2022-01-02 DIAGNOSIS — R410 Disorientation, unspecified: Secondary | ICD-10-CM

## 2022-01-02 LAB — VITAMIN B12: Vitamin B-12: 352 pg/mL (ref 211–911)

## 2022-01-02 NOTE — Patient Instructions (Addendum)
Check B12 level Check 24 hour ambulatory EEG, since your Routine EEG over 12 months you will need one as well. [facility] will give you a call to Schedule.  Further recommendations pending results.

## 2022-11-27 IMAGING — MR MR HEAD WO/W CM
16 of 19 series · 36 of 48 positions shown · IV contrast (Gadavist)
Comparison: None.

CLINICAL DATA: Delirium

EXAM:
MRI HEAD WITHOUT AND WITH CONTRAST
TECHNIQUE: Multiplanar, multiecho pulse sequences of the brain and surrounding
structures were obtained without and with intravenous contrast.
CONTRAST:  10mL GADAVIST GADOBUTROL 1 MMOL/ML IV SOLN

[Series 5: DWI · axial · 3.0mm · 0.88mm/px · z∈[-66,+92]mm · 5 of 108 slices shown (1 of 4)]
[im 1/108]
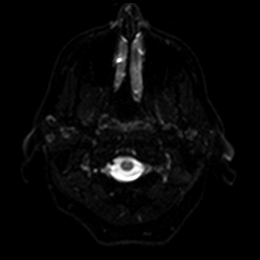
[im 27/108]
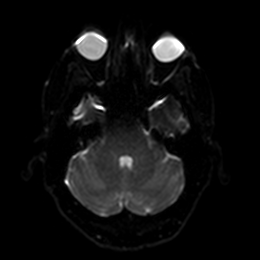
[im 54/108]
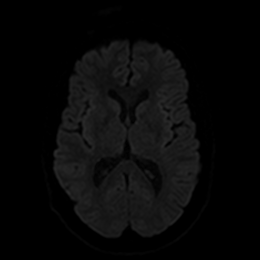
[im 81/108]
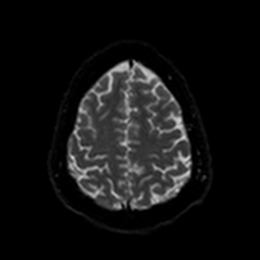
[im 108/108]
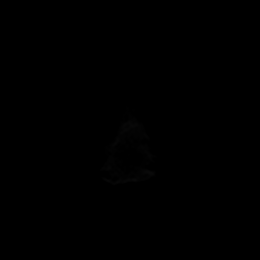

[Series 6: DWI · axial · 3.0mm · 0.88mm/px · z∈[-66,+92]mm · 2 of 53 slices shown (2 of 4)]
[im 1/53]
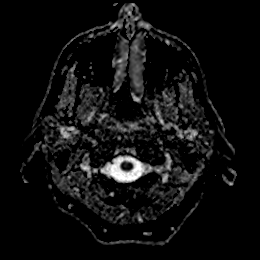
[im 53/53]
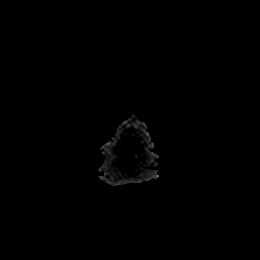

[Series 7: DWI · coronal · 4.0mm · 0.88mm/px · 3 of 70 slices shown (3 of 4)]
[im 1/70]
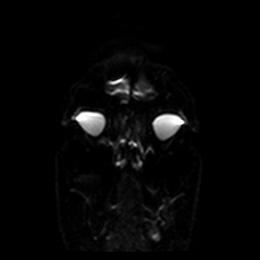
[im 35/70]
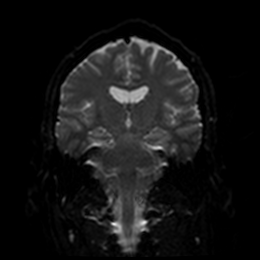
[im 70/70]
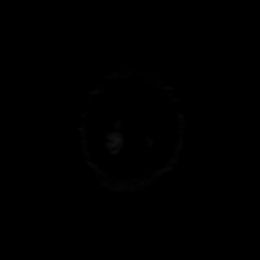

[Series 8: DWI · coronal · 4.0mm · 0.88mm/px · 1 of 35 slices shown (4 of 4)]
[im 1/35]
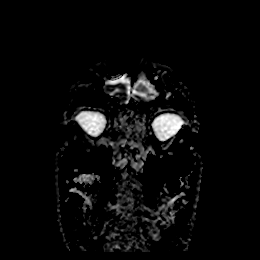

[Series 9: FLAIR · axial · 5.0mm · 0.45mm/px · 1 of 27 slices shown]
[im 1/27]
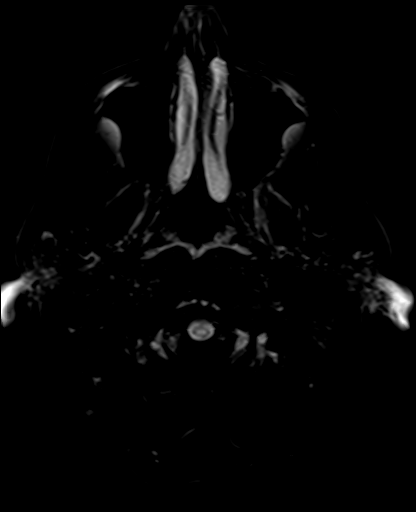

[Series 10: T1 · sagittal · 5.0mm · 0.75mm/px · 1 of 25 slices shown]
[im 1/25]
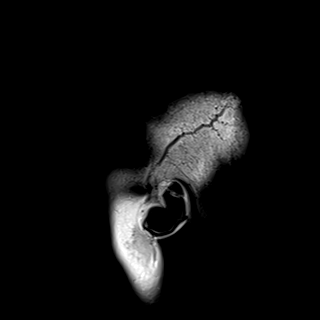

[Series 11: T2 · axial · 5.0mm · 0.72mm/px · 1 of 27 slices shown]
[im 1/27]
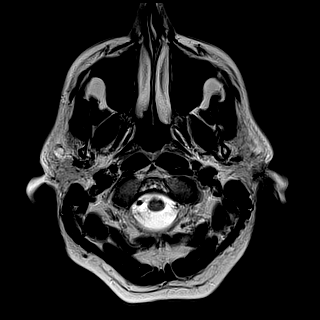

[Series 12: mag_images · axial · 3.0mm · 0.90mm/px · z∈[-76,+101]mm · 2 of 60 slices shown]
[im 1/60]
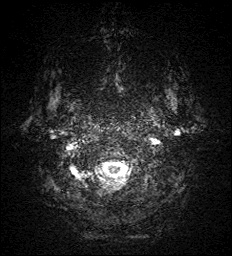
[im 60/60]
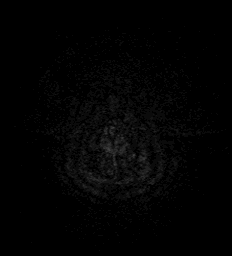

[Series 13: pha_images · axial · 3.0mm · 0.90mm/px · z∈[-76,+101]mm · 2 of 60 slices shown]
[im 1/60]
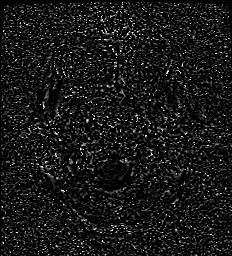
[im 60/60]
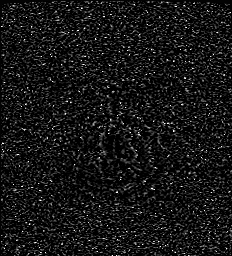

[Series 14: swi_images · axial · 3.0mm · 0.90mm/px · z∈[-76,+101]mm · 2 of 60 slices shown]
[im 1/60]
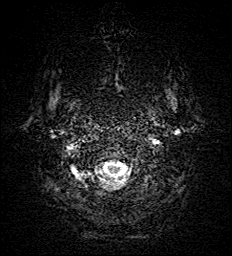
[im 60/60]
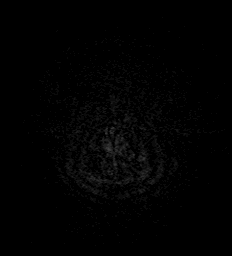

[Series 15: mip_images(sw) · axial · 24.0mm · 0.90mm/px · z∈[-65,+90]mm · 2 of 53 slices shown]
[im 1/53]
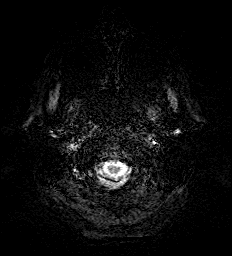
[im 53/53]
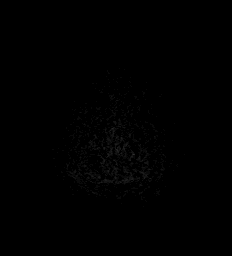

[Series 17: t2_space_dark-fluid_sag_p2_ns-ir · sagittal · 1.0mm · 0.49mm/px · 6 of 160 slices shown]
[im 1/160]
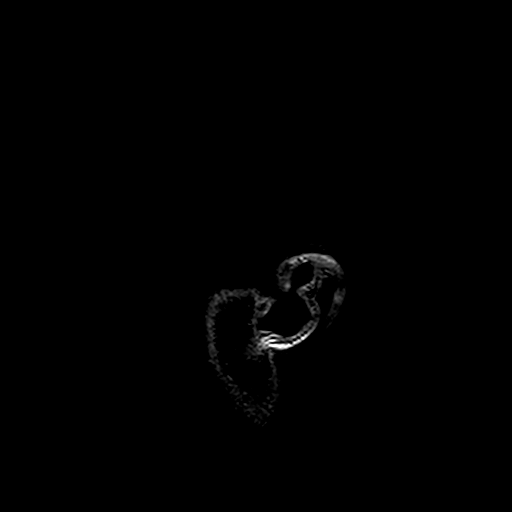
[im 32/160]
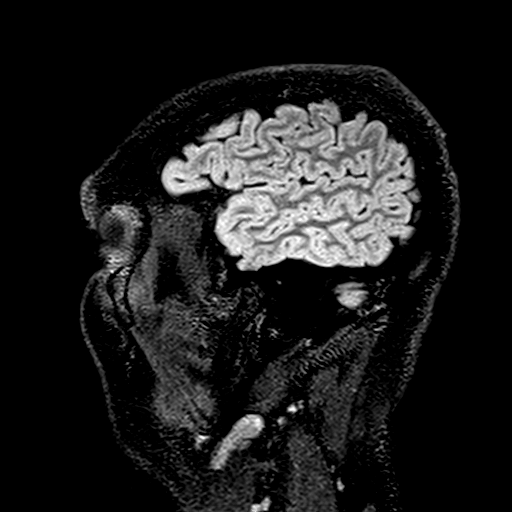
[im 64/160]
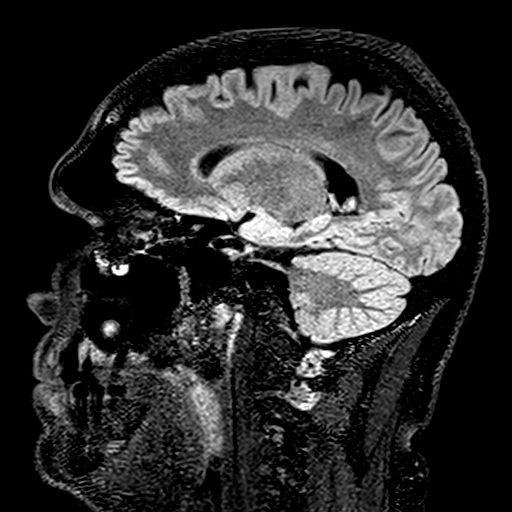
[im 96/160]
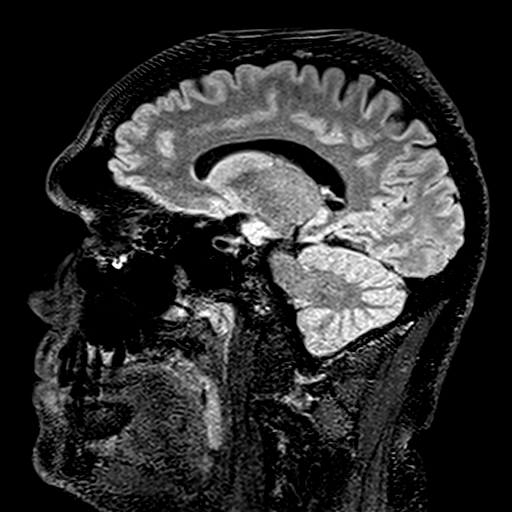
[im 128/160]
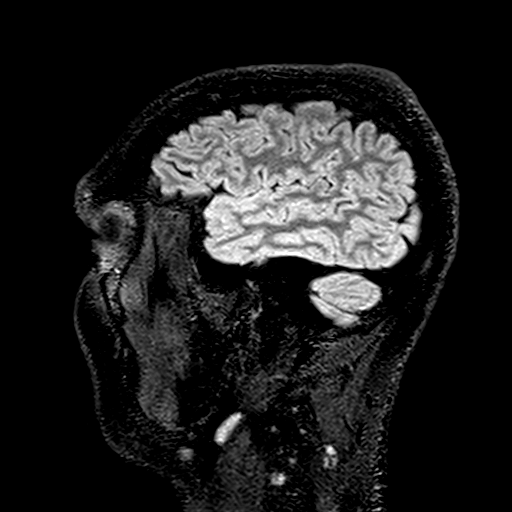
[im 160/160]
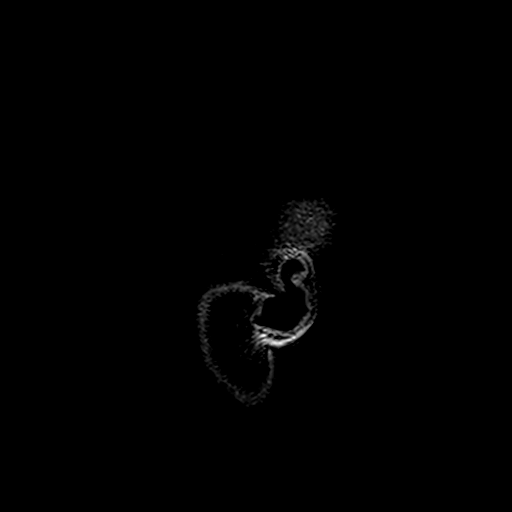

[Series 18: t2_space_dark-fluid_sag_p2_ns-ir_mpr_ axial · axial · 1.0mm · 0.45mm/px · z∈[-75,+58]mm · 5 of 169 slices shown]
[im 1/169]
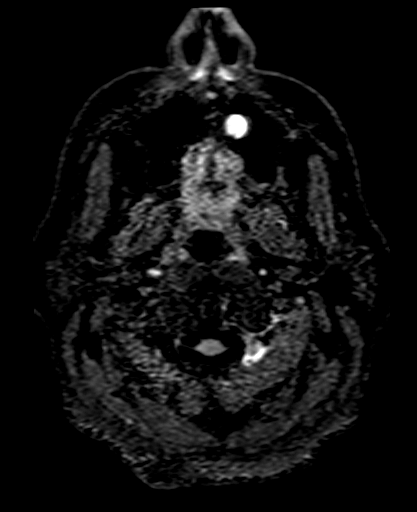
[im 34/169]
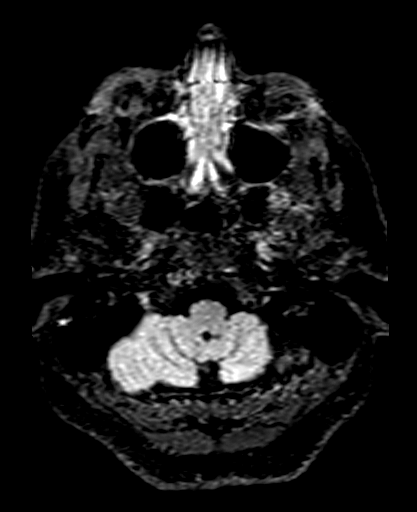
[im 68/169]
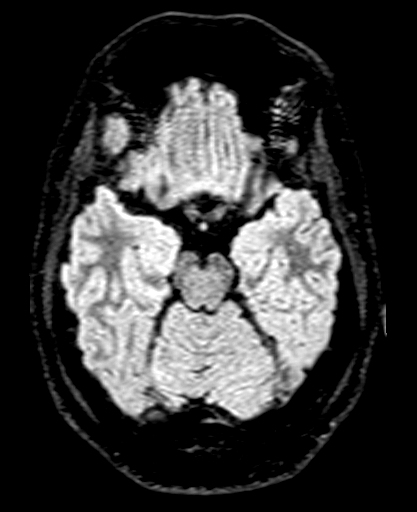
[im 101/169]
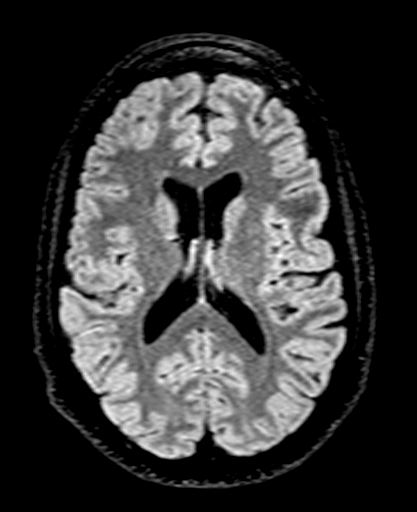
[im 135/169]
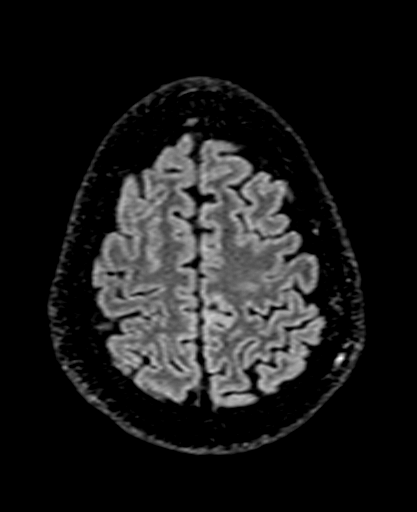

[Series 20: T2 post-contrast · coronal · 5.0mm · 0.72mm/px · 1 of 31 slices shown]
[im 1/31]
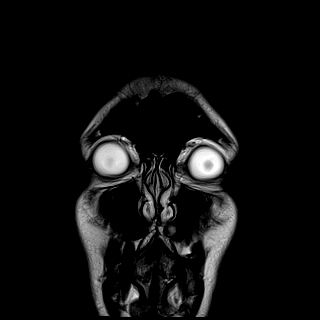

[Series 22: T1 post-contrast · coronal · 5.0mm · 0.34mm/px · 1 of 31 slices shown (1 of 2)]
[im 1/31]
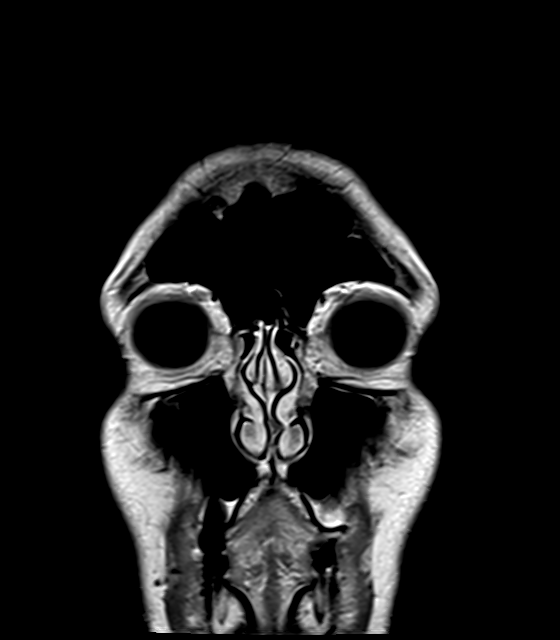

[Series 23: T1 post-contrast · sagittal · 5.0mm · 0.72mm/px · 1 of 25 slices shown (2 of 2)]
[im 1/25]
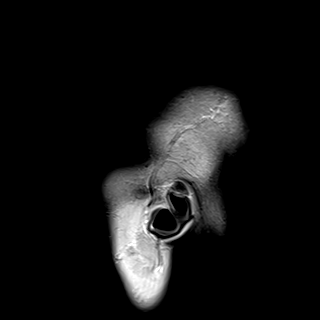

[36 of 48 positions shown; findings below may reference images not displayed]

FINDINGS: Brain: No acute infarct, mass effect or extra-axial collection. No
acute or chronic hemorrhage. Normal white matter signal, parenchymal
volume and CSF spaces. The midline structures are normal. There is
no abnormal contrast enhancement.

Vascular: Major flow voids are preserved.

Skull and upper cervical spine: Normal calvarium and skull base.
Visualized upper cervical spine and soft tissues are normal.

Sinuses/Orbits:No paranasal sinus fluid levels or advanced mucosal
thickening. No mastoid or middle ear effusion. Normal orbits.
IMPRESSION: Normal brain MRI.

## 2024-03-24 ENCOUNTER — Other Ambulatory Visit: Payer: Self-pay

## 2024-03-24 ENCOUNTER — Emergency Department (HOSPITAL_COMMUNITY)

## 2024-03-24 ENCOUNTER — Encounter (HOSPITAL_COMMUNITY): Payer: Self-pay

## 2024-03-24 ENCOUNTER — Emergency Department (HOSPITAL_COMMUNITY)
Admission: EM | Admit: 2024-03-24 | Discharge: 2024-03-25 | Disposition: A | Discharge status: Home / Self Care | Attending: Emergency Medicine | Admitting: Emergency Medicine

## 2024-03-24 DIAGNOSIS — R109 Unspecified abdominal pain: Secondary | ICD-10-CM | POA: Diagnosis present

## 2024-03-24 DIAGNOSIS — R1031 Right lower quadrant pain: Secondary | ICD-10-CM | POA: Insufficient documentation

## 2024-03-24 LAB — CBC WITH DIFFERENTIAL/PLATELET
Abs Immature Granulocytes: 0.01 K/uL (ref 0.00–0.07)
Basophils Absolute: 0.1 K/uL (ref 0.0–0.1)
Basophils Relative: 1 %
Eosinophils Absolute: 0.1 K/uL (ref 0.0–0.5)
Eosinophils Relative: 1 %
HCT: 46 % (ref 39.0–52.0)
Hemoglobin: 15.4 g/dL (ref 13.0–17.0)
Immature Granulocytes: 0 %
Lymphocytes Relative: 33 %
Lymphs Abs: 2.3 K/uL (ref 0.7–4.0)
MCH: 30.4 pg (ref 26.0–34.0)
MCHC: 33.5 g/dL (ref 30.0–36.0)
MCV: 90.9 fL (ref 80.0–100.0)
Monocytes Absolute: 0.5 K/uL (ref 0.1–1.0)
Monocytes Relative: 7 %
Neutro Abs: 4 K/uL (ref 1.7–7.7)
Neutrophils Relative %: 58 %
Platelets: 295 K/uL (ref 150–400)
RBC: 5.06 MIL/uL (ref 4.22–5.81)
RDW: 12.6 % (ref 11.5–15.5)
WBC: 6.9 K/uL (ref 4.0–10.5)
nRBC: 0 % (ref 0.0–0.2)

## 2024-03-24 LAB — URINALYSIS, ROUTINE W REFLEX MICROSCOPIC
Bilirubin Urine: NEGATIVE
Glucose, UA: NEGATIVE mg/dL
Hgb urine dipstick: NEGATIVE
Ketones, ur: NEGATIVE mg/dL
Leukocytes,Ua: NEGATIVE
Nitrite: NEGATIVE
Protein, ur: 30 mg/dL — AB
Specific Gravity, Urine: 1.02 (ref 1.005–1.030)
pH: 5 (ref 5.0–8.0)

## 2024-03-24 LAB — COMPREHENSIVE METABOLIC PANEL WITH GFR
ALT: 48 U/L — ABNORMAL HIGH (ref 0–44)
AST: 28 U/L (ref 15–41)
Albumin: 4.8 g/dL (ref 3.5–5.0)
Alkaline Phosphatase: 116 U/L (ref 38–126)
Anion gap: 14 (ref 5–15)
BUN: 14 mg/dL (ref 6–20)
CO2: 22 mmol/L (ref 22–32)
Calcium: 9.7 mg/dL (ref 8.9–10.3)
Chloride: 100 mmol/L (ref 98–111)
Creatinine, Ser: 0.92 mg/dL (ref 0.61–1.24)
GFR, Estimated: >60 mL/min
Glucose, Bld: 100 mg/dL — ABNORMAL HIGH (ref 70–99)
Potassium: 3.9 mmol/L (ref 3.5–5.1)
Sodium: 136 mmol/L (ref 135–145)
Total Bilirubin: 0.6 mg/dL (ref 0.0–1.2)
Total Protein: 8.2 g/dL — ABNORMAL HIGH (ref 6.5–8.1)

## 2024-03-24 LAB — LIPASE, BLOOD: Lipase: 23 U/L (ref 11–51)

## 2024-03-24 MED ORDER — IOHEXOL 350 MG/ML SOLN
75.0000 mL | Freq: Once | INTRAVENOUS | Status: AC | PRN
  Administered 2024-03-24: 75 mL via INTRAVENOUS

## 2024-03-24 NOTE — ED Triage Notes (Signed)
 Pt to ED c/o sudden onset right lower abdominal pain, which has progressively gotten better. Episode happened aprox 1 hour ago and lasted aprox 10 mins. Last BM yesterday. Denies N/V

## 2024-03-24 NOTE — ED Provider Triage Note (Signed)
 Emergency Medicine Provider Triage Evaluation Note  Jorge Swanson , a 44 y.o. male  was evaluated in triage.  Pt complains of right lower abdominal pain that began approximately an hour ago.  Notes hitting his testicle and he had a severe pain in the bottom right side of the abdomen.  States pain was paralyzing associated with diaphoresis.  No previous abdominal surgeries.  Denies fevers, nausea/vomiting.  Review of Systems  Positive: Abdominal pain Negative: Nausea/vomiting/diarrhea, constipation  Physical Exam  BP 136/87 (BP Location: Right Arm)   Pulse 83   Temp 97.7 F (36.5 C)   Resp 17   SpO2 97%  Gen:   Awake, no distress   Resp:  Normal effort  MSK:   Moves extremities without difficulty  Other:    Medical Decision Making  Medically screening exam initiated at 6:13 PM.  Appropriate orders placed.  Jorge Swanson was informed that the remainder of the evaluation will be completed by another provider, this initial triage assessment does not replace that evaluation, and the importance of remaining in the ED until their evaluation is complete.     Jorge Swanson, [place] 03/24/24 [phone]

## 2024-03-25 MED ORDER — DICYCLOMINE HCL 20 MG PO TABS: 20.0000 mg | ORAL_TABLET | Freq: Two times a day (BID) | ORAL | 0 refills | Status: AC

## 2024-03-25 NOTE — ED Provider Notes (Signed)
 " [facility] AT [facility] Provider Note   CSN: [id number] Arrival date & time: 03/24/24  1751     Patient presents with: Abdominal Pain   Jorge Swanson is a 44 y.o. male resents today for sudden onset right lower quadrant abdominal pain which lasted approximately 10 minutes.  Patient states he still has some minor aching in the same area but the pain has significantly decreased.  Patient denies fever, chills, nausea, vomiting, diarrhea, constipation, any other complaints at this time.    Abdominal Pain      Prior to Admission medications  Medication Sig Start Date End Date Taking? Authorizing Provider  dicyclomine  (BENTYL ) 20 MG tablet Take 1 tablet (20 mg total) by mouth 2 (two) times daily. 03/25/24  Yes Carmesha Morocco N, PA-C    Allergies: Patient has no known allergies.    Review of Systems  Gastrointestinal:  Positive for abdominal pain.    Updated Vital Signs BP (!) 131/91   Pulse 76   Temp 98.7 F (37.1 C)   Resp 16   Ht 6' 4 (1.93 m)   Wt 136.1 kg   SpO2 96%   BMI 36.52 kg/m   Physical Exam Vitals and nursing note reviewed.  Constitutional:      General: He is not in acute distress.    Appearance: He is well-developed. He is not ill-appearing or toxic-appearing.  HENT:     Head: Normocephalic and atraumatic.  Eyes:     Extraocular Movements: Extraocular movements intact.     Conjunctiva/sclera: Conjunctivae normal.  Cardiovascular:     Rate and Rhythm: Normal rate and regular rhythm.     Heart sounds: Normal heart sounds. No murmur heard. Pulmonary:     Effort: Pulmonary effort is normal. No respiratory distress.     Breath sounds: Normal breath sounds.  Abdominal:     General: There is no distension.     Palpations: Abdomen is soft.     Tenderness: There is no abdominal tenderness. There is no guarding.  Musculoskeletal:        General: No swelling.     Cervical back: Neck supple.  Skin:    General: Skin is  warm and dry.     Capillary Refill: Capillary refill takes less than 2 seconds.  Neurological:     General: No focal deficit present.     Mental Status: He is alert and oriented to person, place, and time.  Psychiatric:        Mood and Affect: Mood normal.     (all labs ordered are listed, but only abnormal results are displayed) Labs Reviewed  COMPREHENSIVE METABOLIC PANEL WITH GFR - Abnormal; Notable for the following components:      Result Value   Glucose, Bld 100 (*)    Total Protein 8.2 (*)    ALT 48 (*)    All other components within normal limits  URINALYSIS, ROUTINE W REFLEX MICROSCOPIC - Abnormal; Notable for the following components:   APPearance HAZY (*)    Protein, ur 30 (*)    Bacteria, UA RARE (*)    All other components within normal limits  CBC WITH DIFFERENTIAL/PLATELET  LIPASE, BLOOD    EKG: None  Radiology: CT ABDOMEN PELVIS W CONTRAST Result Date: 03/24/2024 CLINICAL DATA:  Right lower quadrant abdominal pain. EXAM: CT ABDOMEN AND PELVIS WITH CONTRAST TECHNIQUE: Multidetector CT imaging of the abdomen and pelvis was performed using the standard protocol following bolus administration of intravenous  contrast. RADIATION DOSE REDUCTION: This exam was performed according to the departmental dose-optimization program which includes automated exposure control, adjustment of the mA and/or kV according to patient size and/or use of iterative reconstruction technique. CONTRAST:  75mL OMNIPAQUE  IOHEXOL  350 MG/ML SOLN COMPARISON:  CT dated 06/28/2005. FINDINGS: Lower chest: The visualized lung bases are clear. No intra-abdominal free air or free fluid. Hepatobiliary: Fatty liver. No biliary ductal dilatation. The gallbladder is unremarkable with Pancreas: Unremarkable. No pancreatic ductal dilatation or surrounding inflammatory changes. Spleen: Normal in size without focal abnormality. Adrenals/Urinary Tract: The adrenal glands are unremarkable. There is no hydronephrosis on  either side. The visualized ureters and urinary bladder appear unremarkable. Stomach/Bowel: There is sigmoid diverticulosis with there is no bowel obstruction or active inflammation. The appendix is normal. Vascular/Lymphatic: The abdominal aorta and IVC are unremarkable. No portal venous gas. There is no adenopathy. Reproductive: The prostate and seminal vesicles are grossly remarkable. Other: None Musculoskeletal: No acute or significant osseous findings. IMPRESSION: 1. No acute intra-abdominal or pelvic pathology. Normal appendix. 2. Sigmoid diverticulosis. 3. Fatty liver. Electronically Signed   By: Vanetta Chou M.D.   On: 03/24/2024 20:35     Procedures   Medications Ordered in the ED  iohexol  (OMNIPAQUE ) 350 MG/ML injection 75 mL (75 mLs Intravenous Contrast Given 03/24/24 2025)                                    Medical Decision Making Risk Prescription drug management.   This patient presents to the ED for concern of abdominal pain differential diagnosis includes appendicitis, pancreatitis, choledocholithiasis, acute cholecystitis, SBO, ulcerative colitis, diverticulitis, volvulus  Lab Tests:  I Ordered, and personally interpreted labs.  The pertinent results include: CMP unremarkable, lipase 23, CBC unremarkable, UA with 30 protein   Imaging Studies ordered:  I ordered imaging studies including CT abdomen pelvis with contrast I independently visualized and interpreted imaging which showed no acute intra-abdominal or pelvic pathology. I agree with the radiologist interpretation EKG: Normal sinus rhythm   Problem List / ED Course:  Considered for admission or further workup however patient's vital signs, physical exam, labs, and imaging are reassuring.  Patient given symptomatic management outpatient with Bentyl .  Patient given return precautions.  I feel patient safe for discharge at this time.      Final diagnoses:  Abdominal pain, unspecified abdominal location     ED Discharge Orders          Ordered    dicyclomine  (BENTYL ) 20 MG tablet  2 times daily        03/25/24 0131               Francis Ileana SAILOR, PA-C 03/25/24 0150    Jerral Meth, MD 03/25/24 0303  "

## 2024-03-25 NOTE — Discharge Instructions (Addendum)
 Today you were seen for abdominal pain.  Please pick up your Bentyl  and take as needed for abdominal cramping.  Your workup on the emergency department was reassuring.  Thank you for letting us  treat you today. After reviewing your labs and imaging, I feel you are safe to go home. Please follow up with your PCP in the next several days and provide them with your records from this visit. Return to the Emergency Room if pain becomes severe or symptoms worsen.

## 2024-03-26 ENCOUNTER — Other Ambulatory Visit: Payer: Self-pay

## 2024-03-26 ENCOUNTER — Encounter: Payer: Self-pay | Admitting: Family Medicine

## 2024-03-26 ENCOUNTER — Ambulatory Visit: Admitting: Family Medicine

## 2024-03-26 VITALS — BP 130/82 | HR 92 | Ht 76.0 in | Wt 315.2 lb

## 2024-03-26 DIAGNOSIS — K7581 Nonalcoholic steatohepatitis (NASH): Secondary | ICD-10-CM

## 2024-03-26 DIAGNOSIS — R0683 Snoring: Secondary | ICD-10-CM | POA: Diagnosis not present

## 2024-03-26 DIAGNOSIS — R7989 Other specified abnormal findings of blood chemistry: Secondary | ICD-10-CM | POA: Diagnosis not present

## 2024-03-26 DIAGNOSIS — R1031 Right lower quadrant pain: Secondary | ICD-10-CM

## 2024-03-26 DIAGNOSIS — Z09 Encounter for follow-up examination after completed treatment for conditions other than malignant neoplasm: Secondary | ICD-10-CM

## 2024-03-26 LAB — POCT GLYCOSYLATED HEMOGLOBIN (HGB A1C): Hemoglobin A1C: 5.6 % (ref 4.0–5.6)

## 2024-03-26 MED ORDER — WEGOVY 1.5 MG PO TABS: 1.5000 mg | ORAL_TABLET | Freq: Every day | ORAL | 0 refills | Status: AC

## 2024-03-26 NOTE — Patient Instructions (Signed)
 VISIT SUMMARY:  Today, you were seen for abdominal pain that you experienced after an ER visit. You had a severe episode of pain that lasted about ten minutes, along with other symptoms like ringing in your ears, sweating, and feeling faint. Your ER visit included a CT scan and lab tests, which were normal. We discussed your history of sigmoid diverticulosis, fatty liver, and recent weight gain after quitting smoking. We also talked about your current lifestyle, dietary habits, and family history of digestive issues. Your wife has noticed symptoms that may suggest sleep apnea.  YOUR PLAN:  -RIGHT LOWER QUADRANT ABDOMINAL PAIN SECONDARY TO CONSTIPATION AND COLONIC DIVERTICULOSIS: Your abdominal pain is likely due to constipation and diverticulosis, which is a condition where small pouches form in the colon. Since your CT scan and lab tests were normal, we recommend conservative management. Please increase your water intake to 24-30 ounces daily and start taking a fiber supplement like Metamucil.  -METABOLIC DYSFUNCTION-ASSOCIATED STEATOHEPATITIS (MASH): You have fatty liver disease with slightly elevated liver enzymes. This condition can progress if not managed properly. We recommend weight loss to prevent progression. We checked your blood glucose levels and discussed weight loss strategies, including the potential use of GLP-1 agonists, which are medications that can help with weight loss.  -MORBID OBESITY: Your current weight is 315 pounds, and we aim for you to lose 40-60 pounds. We discussed the use of GLP-1 agonists for weight loss, and you prefer the oral formulation due to needle aversion. If the oral medication is not covered by your insurance, we may consider the injectable form.  -SUSPECTED OBSTRUCTIVE SLEEP APNEA: Your symptoms and your partner's observations suggest that you may have sleep apnea, a condition where breathing repeatedly stops and starts during sleep. This condition may qualify  you for weight loss medication.  INSTRUCTIONS:  Please follow up with us  to monitor your progress and discuss any issues with the prescribed treatments. If you experience any new or worsening symptoms, contact us  immediately.

## 2024-03-26 NOTE — Progress Notes (Signed)
 "  Name: Jorge Swanson   Date of Visit: 03/26/24   Date of last visit with me: Visit date not found   CHIEF COMPLAINT:  Chief Complaint  Patient presents with   Establish Care    New patient. Recent ER visit with abdominal pain and unusual CT scan results. No exact diagnosis.         HPI:  Discussed the use of AI scribe software for clinical note transcription with the patient, who gave verbal consent to proceed.  History of Present Illness   Jorge Swanson is a 44 year old male who presents with abdominal pain following an ER visit.  He experienced a severe, debilitating abdominal pain lasting about ten minutes, accompanied by tinnitus, diaphoresis, and presyncope, which prompted him to sit down. This episode occurred after not eating all day, except for a small snack and coffee. He was evaluated in the ER where a CT scan and labs were performed, both of which were unremarkable.  He has a history of sigmoid diverticulosis noted on a scan, but no inflammation was present. He has not started the prescribed antibiotics. The pain was on the right side, whereas diverticulosis is typically on the left.  He has a history of fatty liver with slightly elevated liver enzymes. He weighs 315 pounds and has a history of quitting smoking in 2023, which he associates with weight gain. 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  His dietary habits include irregular meals, often eating once a day, and a lack of fiber intake. He drinks four to five 24-ounce servings of liquids daily, but rarely water. He acknowledges a poor diet due to frequent travel and late-night arrivals.  Family history includes a sister diagnosed with diverticulitis and a wife with colitis. His wife notices symptoms suggestive of sleep apnea, as she often kicks him out of bed due to his snoring.         OBJECTIVE:        03/26/2024    9:37 AM  Depression screen PHQ 2/9  Decreased Interest 0  Down, Depressed, Hopeless 0  PHQ - 2 Score 0     BP Readings from Last 3 Encounters:  03/26/24 130/82  03/25/24 (!) 131/91  01/02/22 123/85    BP 130/82   Pulse 92   Ht 6' 4 (1.93 m)   Wt (!) 315 lb 3.2 oz (143 kg)   SpO2 98%   BMI 38.37 kg/m    Physical Exam   MEASUREMENTS: Height- 6'4, Weight- 315.      Physical Exam  ASSESSMENT/PLAN:   Assessment & Plan Morbid obesity (HCC)  Right lower quadrant abdominal pain  Hospital discharge follow-up  Elevated LFTs  Metabolic dysfunction-associated steatohepatitis (MASH)  Snoring    Assessment and Plan    Right lower quadrant abdominal pain secondary to constipation and colonic diverticulosis Intermittent pain likely due to constipation and diverticulosis. CT and labs unremarkable. No diverticulitis. Conservative management recommended. - Increase water intake to 24-30 ounces daily. - Start fiber supplement such as Metamucil.  Metabolic dysfunction-associated steatohepatitis (MASH) Fatty liver with slightly elevated liver enzymes. No acute issues. Weight loss recommended to prevent progression. - Checked blood glucose levels via pin prick test. - Discussed weight loss strategies, including potential use of GLP-1 agonists.  Morbid obesity Current weight 315 pounds. Goal to lose 40-60 pounds. Discussed GLP-1 agonists for weight loss,  preference for oral formulation due to needle aversion. - Attempt oral GLP-1 agonist if covered by insurance. - Consider injectable GLP-1 agonist if oral formulation not covered.  Suspected obstructive sleep apnea Symptoms and partner's observations suggest sleep apnea. Potential qualification for weight loss medication due to sleep apnea.         Jehan Bonano A. Vita MD [facility] "

## 2024-03-26 NOTE — Addendum Note (Signed)
 Addended by: LATTIE CARLO BROCKS on: 03/26/2024 10:43 AM   Modules accepted: Orders

## 2024-04-01 ENCOUNTER — Other Ambulatory Visit (HOSPITAL_COMMUNITY): Payer: Self-pay

## 2024-04-01 ENCOUNTER — Telehealth: Payer: Self-pay | Admitting: Pharmacist

## 2024-04-01 NOTE — Telephone Encounter (Signed)
 Pharmacy Patient Advocate Encounter   Received notification from Physician's Office that prior authorization for Wegovy  Tablets is required/requested.   Insurance verification completed.   The patient is insured through [employer] [facility] [employer].   Per test claim: Per test claim, medication is not covered due to plan/benefit exclusion, PA not submitted at this time

## 2024-04-02 ENCOUNTER — Other Ambulatory Visit (HOSPITAL_COMMUNITY): Payer: Self-pay

## 2024-04-27 ENCOUNTER — Ambulatory Visit (HOSPITAL_BASED_OUTPATIENT_CLINIC_OR_DEPARTMENT_OTHER): Admitting: Pulmonary Disease

## 2024-05-26 ENCOUNTER — Ambulatory Visit: Admitting: Family Medicine
